# Patient Record
Sex: Male | Born: 1975
Health system: Southern US, Community
[De-identification: ages and names within clinical notes are randomized; demographics above are authoritative.]

## PROBLEM LIST (undated history)

## (undated) ENCOUNTER — Emergency Department (HOSPITAL_COMMUNITY): Admission: EM | Payer: BC Managed Care – PPO

## (undated) DIAGNOSIS — D689 Coagulation defect, unspecified: Secondary | ICD-10-CM

## (undated) DIAGNOSIS — D6800 Von Willebrand disease, unspecified: Secondary | ICD-10-CM

## (undated) DIAGNOSIS — E079 Disorder of thyroid, unspecified: Secondary | ICD-10-CM

## (undated) DIAGNOSIS — M1990 Unspecified osteoarthritis, unspecified site: Secondary | ICD-10-CM

## (undated) HISTORY — PX: APPENDECTOMY: SHX54

## (undated) HISTORY — DX: Von Willebrand's disease: D68.0

## (undated) HISTORY — PX: WISDOM TOOTH EXTRACTION: SHX21

## (undated) HISTORY — DX: Unspecified osteoarthritis, unspecified site: M19.90

## (undated) HISTORY — DX: Von Willebrand disease, unspecified: D68.00

## (undated) HISTORY — DX: Disorder of thyroid, unspecified: E07.9

## (undated) HISTORY — DX: Coagulation defect, unspecified: D68.9

---

## 2001-01-17 HISTORY — PX: OTHER SURGICAL HISTORY: SHX169

## 2009-12-01 ENCOUNTER — Encounter: Admission: RE | Admit: 2009-12-01 | Discharge: 2009-12-01 | Payer: Self-pay | Admitting: Family Medicine

## 2010-02-06 ENCOUNTER — Other Ambulatory Visit: Payer: Self-pay | Admitting: Family Medicine

## 2010-02-06 DIAGNOSIS — E041 Nontoxic single thyroid nodule: Secondary | ICD-10-CM

## 2010-05-31 ENCOUNTER — Ambulatory Visit
Admission: RE | Admit: 2010-05-31 | Discharge: 2010-05-31 | Disposition: A | Discharge status: Home / Self Care | Payer: BC Managed Care – PPO | Source: Ambulatory Visit | Attending: Family Medicine | Admitting: Family Medicine

## 2010-05-31 DIAGNOSIS — E041 Nontoxic single thyroid nodule: Secondary | ICD-10-CM

## 2010-06-03 ENCOUNTER — Other Ambulatory Visit: Payer: Self-pay | Admitting: Family Medicine

## 2010-06-03 DIAGNOSIS — E041 Nontoxic single thyroid nodule: Secondary | ICD-10-CM

## 2010-06-15 ENCOUNTER — Other Ambulatory Visit: Payer: Self-pay | Admitting: Family Medicine

## 2010-06-15 ENCOUNTER — Ambulatory Visit
Admission: RE | Admit: 2010-06-15 | Discharge: 2010-06-15 | Disposition: A | Discharge status: Home / Self Care | Payer: BC Managed Care – PPO | Source: Ambulatory Visit | Attending: Family Medicine | Admitting: Family Medicine

## 2010-06-15 DIAGNOSIS — E041 Nontoxic single thyroid nodule: Secondary | ICD-10-CM

## 2010-08-27 ENCOUNTER — Ambulatory Visit (INDEPENDENT_AMBULATORY_CARE_PROVIDER_SITE_OTHER): Payer: BC Managed Care – PPO | Admitting: Internal Medicine

## 2010-08-27 ENCOUNTER — Encounter: Payer: Self-pay | Admitting: Internal Medicine

## 2010-08-27 VITALS — BP 104/80 | HR 63 | Temp 98.5°F | Resp 14 | Ht 76.5 in | Wt 233.6 lb

## 2010-08-27 DIAGNOSIS — R350 Frequency of micturition: Secondary | ICD-10-CM

## 2010-08-27 DIAGNOSIS — Z0000 Encounter for general adult medical examination without abnormal findings: Secondary | ICD-10-CM

## 2010-08-27 DIAGNOSIS — M2550 Pain in unspecified joint: Secondary | ICD-10-CM

## 2010-08-27 DIAGNOSIS — Z23 Encounter for immunization: Secondary | ICD-10-CM

## 2010-08-27 LAB — POCT URINALYSIS DIPSTICK
Bilirubin, UA: NEGATIVE
Blood, UA: NEGATIVE
Nitrite, UA: NEGATIVE
Protein, UA: NEGATIVE
pH, UA: 7

## 2010-08-27 MED ORDER — TETANUS-DIPHTH-ACELL PERTUSSIS 5-2.5-18.5 LF-MCG/0.5 IM SUSP
0.5000 mL | Freq: Once | INTRAMUSCULAR | Status: AC
  Administered 2010-08-27: 0.5 mL via INTRAMUSCULAR

## 2010-08-27 NOTE — Patient Instructions (Signed)
Preventive Health Care: Eat a low-fat diet with lots of fruits and vegetables, up to 7-9 servings per day. Consume less than 40 grams of sugar per day from foods & drinks with High Fructose Corn Sugar as #1,2,3 or # 4 on label. Interventions to raise HDL or GOOD cholesterol include: exercising 30-45 minutes 3-4 X per week; including salmon & tuna in the diet;  & supplementing with Omega 3 fatty acids (Flax or Fish oil )  1-2 grams per day. The B vitamin Niacin also raises HDL but has a vasodilating effect which may cause flushing.

## 2010-08-27 NOTE — Progress Notes (Signed)
Subjective:    Patient ID: Chris Perez, male    DOB: 26-Sep-1975, 35 y.o.   MRN: [id number]  HPI  Chris Perez  is here for a physical;acute issues  Include diffuse arthralgias. He's had symptoms especially in the lower extremities but also involving the hands shoulders jaw for 6-7 years. Glucosamine sulfate/chondroitin supplement for 4 months did not seem to be of benefit. He avoids aspirin because of von Willebrand's variant. His mother has "arthritis"       Review of Systems Patient reports no significant  vision/ hearing changes,anorexia, weight change, fever ,adenopathy, persistant / recurrent hoarseness, swallowing issues, chest pain,palpitations, edema,persistant / recurrent cough, hemoptysis, dyspnea(rest, exertional, paroxysmal nocturnal), gastrointestinal  bleeding (melena, rectal bleeding), abdominal pain, excessive heart burn, GU symptoms( dysuria, hematuria, pyuria) syncope, focal weakness,numbness & tingling, skin/hair/nail changes,depression, anxiety, abnormal bruising/bleeding,myalgias or joint swelling or redness.  He states he had a physical in November of 2011 Labs were normal except for slightly reduced HDL. He has been taking Fish oil  for that reason. He states his white count was low in 2009. He is unsure as to whether it has changed.     Objective:   Physical Exam Gen.: Healthy and well-nourished in appearance. Alert, appropriate and cooperative throughout exam. Head: Normocephalic without obvious abnormalities;  no alopecia  Eyes: No corneal or conjunctival inflammation noted. Pupils equal round reactive to light and accommodation. Fundal exam is benign without hemorrhages, exudate, papilledema. Extraocular motion intact. Vision grossly normal with lenses. Ears: External  ear exam reveals no significant lesions or deformities. Canals clear .TMs normal. Hearing is grossly normal bilaterally. Nose: External nasal exam reveals no deformity or inflammation. Nasal mucosa are pink  and moist. No lesions or exudates noted. Septum not deviated Mouth: Oral mucosa and oropharynx reveal no lesions or exudates. Teeth in good repair. Neck: No deformities, masses, or tenderness noted. Range of motion & . Thyroid normal. Lungs: Normal respiratory effort; chest expands symmetrically. Lungs are clear to auscultation without rales, wheezes, or increased work of breathing. Heart: Normal rate and rhythm. Normal S1 and S2. No gallop, click, or rub. S4 vs slight click @ apex ; no  murmur. Abdomen: Bowel sounds normal; abdomen soft and nontender. No masses, organomegaly or hernias noted. Genitalia: Normal exam except for very small granuloma in the right supratesticular area.  .                                                                                   Musculoskeletal/extremities: No deformity or scoliosis noted of  the thoracic or lumbar spine. No clubbing, cyanosis, edema, or deformity noted. Range of motion  normal .Tone & strength  normal.Joints normal. Nail health  good. Vascular: Carotid, radial artery, dorsalis pedis and  posterior tibial pulses are full and equal. No bruits present. Neurologic: Alert and oriented x3. Deep tendon reflexes symmetrical and normal.          Skin: Intact without suspicious lesions or rashes. Lymph: No cervical, axillary, or inguinal lymphadenopathy present. Psych: Mood and affect are normal. Normally interactive  Assessment & Plan:  #1 comprehensive physical exam; no acute findings #2 see Problem List with Assessments & Recommendations #3 diffuse arthralgias with negative musculoskeletal and dermatologic exams. Plan: see Orders

## 2010-08-31 ENCOUNTER — Ambulatory Visit: Payer: Self-pay | Admitting: Internal Medicine

## 2010-09-07 ENCOUNTER — Other Ambulatory Visit: Payer: Self-pay | Admitting: Internal Medicine

## 2010-09-07 ENCOUNTER — Other Ambulatory Visit: Payer: Self-pay | Admitting: *Deleted

## 2010-09-07 DIAGNOSIS — Z0000 Encounter for general adult medical examination without abnormal findings: Secondary | ICD-10-CM

## 2010-09-09 ENCOUNTER — Other Ambulatory Visit (INDEPENDENT_AMBULATORY_CARE_PROVIDER_SITE_OTHER): Payer: BC Managed Care – PPO

## 2010-09-09 DIAGNOSIS — Z0000 Encounter for general adult medical examination without abnormal findings: Secondary | ICD-10-CM

## 2010-09-09 LAB — CBC WITH DIFFERENTIAL/PLATELET
Eosinophils Relative: 1.5 % (ref 0.0–5.0)
HCT: 43.1 % (ref 39.0–52.0)
Hemoglobin: 14.6 g/dL (ref 13.0–17.0)
Lymphocytes Relative: 33.8 % (ref 12.0–46.0)
Lymphs Abs: 1.1 10*3/uL (ref 0.7–4.0)
Monocytes Relative: 9.4 % (ref 3.0–12.0)
Neutro Abs: 1.8 10*3/uL (ref 1.4–7.7)
RBC: 4.63 Mil/uL (ref 4.22–5.81)
WBC: 3.4 10*3/uL — ABNORMAL LOW (ref 4.5–10.5)

## 2010-09-09 LAB — LIPID PANEL
Cholesterol: 152 mg/dL (ref 0–200)
LDL Cholesterol: 72 mg/dL (ref 0–99)
Triglycerides: 186 mg/dL — ABNORMAL HIGH (ref 0.0–149.0)

## 2010-09-09 LAB — BASIC METABOLIC PANEL
CO2: 27 mEq/L (ref 19–32)
Calcium: 9.3 mg/dL (ref 8.4–10.5)
Chloride: 106 mEq/L (ref 96–112)
Creatinine, Ser: 0.9 mg/dL (ref 0.4–1.5)
Glucose, Bld: 95 mg/dL (ref 70–99)

## 2010-09-09 LAB — POCT URINALYSIS DIPSTICK
Blood, UA: NEGATIVE
Glucose, UA: NEGATIVE
Ketones, UA: NEGATIVE
Protein, UA: NEGATIVE
Spec Grav, UA: 1.01
Urobilinogen, UA: 0.2

## 2010-09-09 LAB — SEDIMENTATION RATE: Sed Rate: 9 mm/hr (ref 0–22)

## 2010-09-09 LAB — TSH: TSH: 2.76 u[IU]/mL (ref 0.35–5.50)

## 2010-09-09 LAB — HEPATIC FUNCTION PANEL: Albumin: 4.7 g/dL (ref 3.5–5.2)

## 2010-09-09 NOTE — Progress Notes (Signed)
Labs only

## 2010-11-25 ENCOUNTER — Encounter: Payer: Self-pay | Admitting: Internal Medicine

## 2010-11-26 ENCOUNTER — Ambulatory Visit (INDEPENDENT_AMBULATORY_CARE_PROVIDER_SITE_OTHER): Payer: BC Managed Care – PPO | Admitting: Internal Medicine

## 2010-11-26 ENCOUNTER — Encounter: Payer: Self-pay | Admitting: Internal Medicine

## 2010-11-26 DIAGNOSIS — R002 Palpitations: Secondary | ICD-10-CM

## 2010-11-26 DIAGNOSIS — D680 Von Willebrand's disease: Secondary | ICD-10-CM | POA: Insufficient documentation

## 2010-11-26 DIAGNOSIS — R51 Headache: Secondary | ICD-10-CM

## 2010-11-26 LAB — CBC WITH DIFFERENTIAL/PLATELET
Eosinophils Absolute: 0.1 10*3/uL (ref 0.0–0.7)
Eosinophils Relative: 1 % (ref 0–5)
HCT: 39.4 % (ref 39.0–52.0)
Lymphocytes Relative: 40 % (ref 12–46)
Lymphs Abs: 1.4 10*3/uL (ref 0.7–4.0)
MCH: 31.3 pg (ref 26.0–34.0)
MCV: 89.3 fL (ref 78.0–100.0)
Monocytes Absolute: 0.4 10*3/uL (ref 0.1–1.0)
Monocytes Relative: 11 % (ref 3–12)
Platelets: 246 10*3/uL (ref 150–400)
RBC: 4.41 MIL/uL (ref 4.22–5.81)
WBC: 3.6 10*3/uL — ABNORMAL LOW (ref 4.0–10.5)

## 2010-11-26 LAB — BASIC METABOLIC PANEL
BUN: 16 mg/dL (ref 6–23)
CO2: 26 mEq/L (ref 19–32)
Chloride: 100 mEq/L (ref 96–112)
Creat: 0.91 mg/dL (ref 0.50–1.35)
Glucose, Bld: 95 mg/dL (ref 70–99)

## 2010-11-26 MED ORDER — TRAMADOL HCL 50 MG PO TABS: 50.0000 mg | ORAL_TABLET | Freq: Four times a day (QID) | ORAL | Status: AC | PRN
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
## 2010-11-26 NOTE — Progress Notes (Signed)
Addended by: Legrand Como on: 11/26/2010 04:48 PM   Modules accepted: Orders

## 2010-11-26 NOTE — Patient Instructions (Addendum)
Please keep a diary of your headaches . Document  each occurrence on the calendar with notation of : #1 any prodrome ( any non headache symptom such as marked fatigue,visual changes, ,etc ) which precedes actual headache ; #2) severity on 1-10 scale; #3) any triggers ( food/ drink,enviromenntal or weather changes ,physical or emotional stress) in 8-12 hour period prior to the headache; & #4) response to any medications or other intervention. Please review "Headache" @ [facility] for additional information.   To prevent palpitations or premature beats, avoid stimulants such as decongestants, diet pills, nicotine, or caffeine (coffee, tea, cola, or chocolate) to excess.

## 2010-11-29 ENCOUNTER — Telehealth: Payer: Self-pay | Admitting: Internal Medicine

## 2010-11-29 NOTE — Telephone Encounter (Signed)
Pt is requesting a copy of 08/2010 labs and 11/26/10 OV. Mailed to pt.

## 2011-01-05 ENCOUNTER — Ambulatory Visit (INDEPENDENT_AMBULATORY_CARE_PROVIDER_SITE_OTHER): Payer: BC Managed Care – PPO | Admitting: Internal Medicine

## 2011-01-05 ENCOUNTER — Encounter: Payer: Self-pay | Admitting: Internal Medicine

## 2011-01-05 ENCOUNTER — Ambulatory Visit (INDEPENDENT_AMBULATORY_CARE_PROVIDER_SITE_OTHER)
Admission: RE | Admit: 2011-01-05 | Discharge: 2011-01-05 | Disposition: A | Discharge status: Home / Self Care | Payer: BC Managed Care – PPO | Source: Ambulatory Visit | Attending: Internal Medicine | Admitting: Internal Medicine

## 2011-01-05 VITALS — BP 120/78 | HR 82 | Temp 98.3°F | Wt 232.6 lb

## 2011-01-05 DIAGNOSIS — D680 Von Willebrand's disease: Secondary | ICD-10-CM

## 2011-01-05 DIAGNOSIS — R51 Headache: Secondary | ICD-10-CM

## 2011-01-05 MED ORDER — RIZATRIPTAN BENZOATE 10 MG PO TBDP: 10.0000 mg | ORAL_TABLET | ORAL | Status: DC | PRN

## 2011-01-05 NOTE — Patient Instructions (Signed)
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
## 2011-01-06 ENCOUNTER — Telehealth: Payer: Self-pay

## 2011-01-06 ENCOUNTER — Telehealth: Payer: Self-pay | Admitting: Internal Medicine

## 2011-01-06 ENCOUNTER — Emergency Department (HOSPITAL_COMMUNITY)
Admission: EM | Admit: 2011-01-06 | Discharge: 2011-01-06 | Disposition: A | Discharge status: Home / Self Care | Payer: BC Managed Care – PPO | Attending: Emergency Medicine | Admitting: Emergency Medicine

## 2011-01-06 ENCOUNTER — Encounter (HOSPITAL_COMMUNITY): Payer: Self-pay | Admitting: *Deleted

## 2011-01-06 DIAGNOSIS — D6800 Von Willebrand disease, unspecified: Secondary | ICD-10-CM | POA: Insufficient documentation

## 2011-01-06 DIAGNOSIS — G441 Vascular headache, not elsewhere classified: Secondary | ICD-10-CM

## 2011-01-06 DIAGNOSIS — G43909 Migraine, unspecified, not intractable, without status migrainosus: Secondary | ICD-10-CM

## 2011-01-06 MED ORDER — OXYCODONE-ACETAMINOPHEN 5-325 MG PO TABS: 1.0000 | ORAL_TABLET | ORAL | Status: AC | PRN

## 2011-01-06 MED ORDER — OXYCODONE-ACETAMINOPHEN 5-325 MG PO TABS
1.0000 | ORAL_TABLET | Freq: Once | ORAL | Status: AC
  Administered 2011-01-06: 1 via ORAL
  Filled 2011-01-06: qty 1

## 2011-01-06 MED ORDER — METOCLOPRAMIDE HCL 5 MG/ML IJ SOLN
10.0000 mg | Freq: Once | INTRAMUSCULAR | Status: AC
  Administered 2011-01-06: 10 mg via INTRAVENOUS
  Filled 2011-01-06: qty 2

## 2011-01-06 MED ORDER — DEXAMETHASONE SODIUM PHOSPHATE 10 MG/ML IJ SOLN
10.0000 mg | Freq: Once | INTRAMUSCULAR | Status: AC
  Administered 2011-01-06: 10 mg via INTRAVENOUS
  Filled 2011-01-06: qty 1

## 2011-01-06 MED ORDER — SODIUM CHLORIDE 0.9 % IV BOLUS (SEPSIS)
1000.0000 mL | Freq: Once | INTRAVENOUS | Status: AC
  Administered 2011-01-06: 1000 mL via INTRAVENOUS

## 2011-01-06 MED ORDER — DIPHENHYDRAMINE HCL 50 MG/ML IJ SOLN
25.0000 mg | Freq: Once | INTRAMUSCULAR | Status: AC
  Administered 2011-01-06: 25 mg via INTRAVENOUS
  Filled 2011-01-06: qty 1

## 2011-01-06 NOTE — ED Notes (Signed)
Report received, assumed care.  

## 2011-01-06 NOTE — Telephone Encounter (Signed)
Patient seen in ER and was told to contact primary care doctor to see if MRI to be ordered or referral  Per Dr.Hopper patient to be referred to Neurology, Referral coordinator informed referral placed ASAP

## 2011-01-06 NOTE — Telephone Encounter (Signed)
Recommend Percocet one every 4 hours as needed, dispense 15.

## 2011-01-06 NOTE — Telephone Encounter (Signed)
Per call from patient's wife, Dorene Grebe, neither the Tramadol or the Maxalt that was given to patient by Dr. Alwyn Ren have worked.  Are there other medication(s) for patient to try?  His appointment with [facility] is tomorrow, 01-07-11, at 12:30pm.  Please advise.

## 2011-01-06 NOTE — Telephone Encounter (Signed)
Rx printed, signed & ready for p/u. Pt's wife informed.
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
## 2011-01-07 ENCOUNTER — Encounter: Payer: Self-pay | Admitting: Neurology

## 2011-01-07 ENCOUNTER — Ambulatory Visit (INDEPENDENT_AMBULATORY_CARE_PROVIDER_SITE_OTHER): Payer: BC Managed Care – PPO | Admitting: Neurology

## 2011-01-07 VITALS — BP 100/64 | HR 80 | Wt 232.8 lb

## 2011-01-07 DIAGNOSIS — R51 Headache: Secondary | ICD-10-CM

## 2011-01-07 MED ORDER — AMITRIPTYLINE HCL 25 MG PO TABS: 25.0000 mg | ORAL_TABLET | Freq: Every day | ORAL | Status: DC
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
## 2011-01-07 NOTE — Patient Instructions (Signed)
Follow up appointment with Dr. Modesto Charon is on 02/21/11 at 10:30 am.  Your MRI is scheduled at [facility] on Thursday, Jan. 3rd at 5:00 pm.  Please arrive 15 minutes prior to the appointment time.  [phone].

## 2011-01-08 NOTE — ED Provider Notes (Signed)
Medical screening examination/treatment/procedure(s) were performed by non-physician practitioner and as supervising physician I was immediately available for consultation/collaboration.   Anella Nakata R Derwood Becraft, MD 01/08/11 0703 

## 2011-01-19 ENCOUNTER — Telehealth: Payer: Self-pay | Admitting: Neurology

## 2011-01-19 NOTE — Telephone Encounter (Signed)
Pt called to check on time of MRI scheduled for 01/20/2011. Attempted to return call to only phone number available, but they number seemed to disconnected. Unable to reach patient.

## 2011-01-20 ENCOUNTER — Ambulatory Visit (HOSPITAL_COMMUNITY)
Admission: RE | Admit: 2011-01-20 | Discharge: 2011-01-20 | Disposition: A | Discharge status: Home / Self Care | Payer: BC Managed Care – PPO | Source: Ambulatory Visit | Attending: Neurology | Admitting: Neurology

## 2011-01-20 DIAGNOSIS — R51 Headache: Secondary | ICD-10-CM | POA: Insufficient documentation

## 2011-01-20 DIAGNOSIS — J3489 Other specified disorders of nose and nasal sinuses: Secondary | ICD-10-CM | POA: Insufficient documentation

## 2011-01-20 MED ORDER — GADOBENATE DIMEGLUMINE 529 MG/ML IV SOLN
20.0000 mL | Freq: Once | INTRAVENOUS | Status: AC | PRN
  Administered 2011-01-20: 20 mL via INTRAVENOUS

## 2011-01-24 MED ORDER — GADOBENATE DIMEGLUMINE 529 MG/ML IV SOLN
20.0000 mL | Freq: Once | INTRAVENOUS | Status: AC | PRN
  Administered 2011-01-24: 20 mL via INTRAVENOUS

## 2011-02-21 ENCOUNTER — Ambulatory Visit: Payer: BC Managed Care – PPO | Admitting: Neurology

## 2011-03-10 ENCOUNTER — Encounter: Payer: Self-pay | Admitting: Internal Medicine

## 2011-03-10 ENCOUNTER — Ambulatory Visit (INDEPENDENT_AMBULATORY_CARE_PROVIDER_SITE_OTHER): Payer: BC Managed Care – PPO | Admitting: Internal Medicine

## 2011-03-10 VITALS — BP 104/78 | HR 76 | Temp 98.6°F | Wt 230.2 lb

## 2011-03-10 DIAGNOSIS — D489 Neoplasm of uncertain behavior, unspecified: Secondary | ICD-10-CM

## 2011-03-10 DIAGNOSIS — L0390 Cellulitis, unspecified: Secondary | ICD-10-CM

## 2011-03-10 DIAGNOSIS — L0291 Cutaneous abscess, unspecified: Secondary | ICD-10-CM

## 2011-03-10 MED ORDER — MUPIROCIN 2 % EX OINT: TOPICAL_OINTMENT | CUTANEOUS | Status: AC

## 2011-03-10 NOTE — Progress Notes (Signed)
  Subjective:    Patient ID: Chris Perez, male    DOB: 07-13-1975, 36 y.o.   MRN: [id number]  HPI 3 weeks ago it polypoid lesion arose at the angle of the right mandible without predisposition . Other than minor discomfort with trauma it is asymptomatic.  He's had no other similar lesions; specifically there've been no inguinal area or  genital warts.  He had a "mole" removed from the right upper back which was benign. There is no personal or family history of squamous cell, basal cell cancer or melanoma.   Review of Systems he's had no constitutional symptoms of fever, chills or sweats.      Objective:   Physical Exam he is healthy and well-nourished.  He has no lymphadenopathy about the neck or axilla.  He has no organomegaly or masses.  The operative site over the upper back has bland hyperpigmentation but is well healed.  He has a verrucoid, polypoid lesion at the angle of the right jaw with some basilar erythema.         Assessment & Plan:   #1 neoplasm, uncertain etiology. This appears to be a benign polypoid, verrucoid lesion. There is possible mild cellulitis at its base.  Plan: Bactroban twice a day after cleansing. Dermatologic consult for excision.

## 2011-03-10 NOTE — Patient Instructions (Signed)
Applied antibiotic ointment to the base of the lesion twice a day after cleansing with a mild soap.

## 2011-04-13 ENCOUNTER — Ambulatory Visit: Payer: BC Managed Care – PPO | Admitting: Internal Medicine

## 2011-04-13 DIAGNOSIS — Z0289 Encounter for other administrative examinations: Secondary | ICD-10-CM

## 2012-03-09 IMAGING — CT CT HEAD W/O CM
1 series · 16 of 30 positions shown, 20 images · non-contrast
Comparison: None

CLINICAL DATA: Headache, right retro-orbital pain

CT HEAD WITHOUT CONTRAST
TECHNIQUE: Contiguous axial images were obtained from the base of
the skull through the vertex without contrast.

[Series 2: head_seq -c 4.5 h37s st · axial · 0.47mm/px · z∈[-133,+11]mm · 16 of 36 slices shown, 20 images]
[im 2/36  brain]
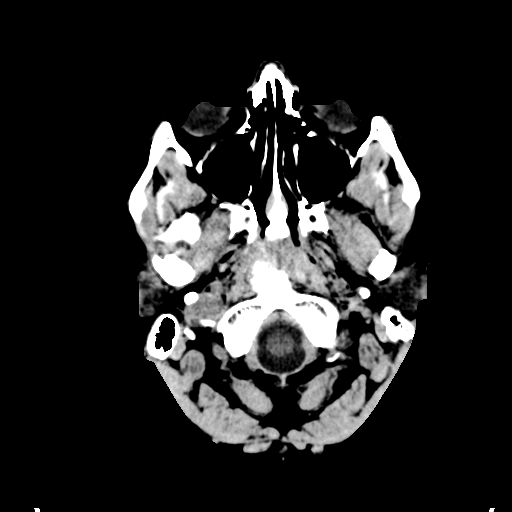
[im 2/36  bone]
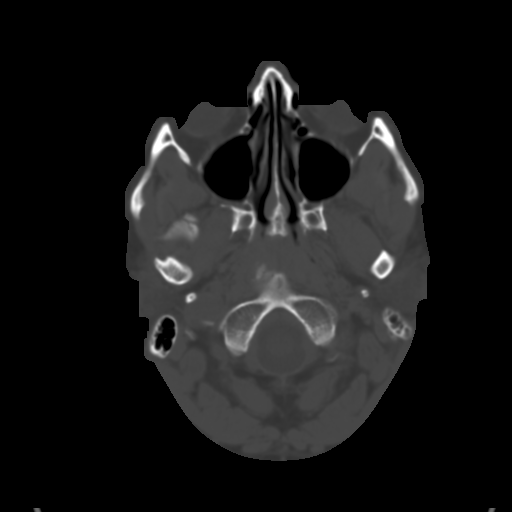
[im 4/36  brain]
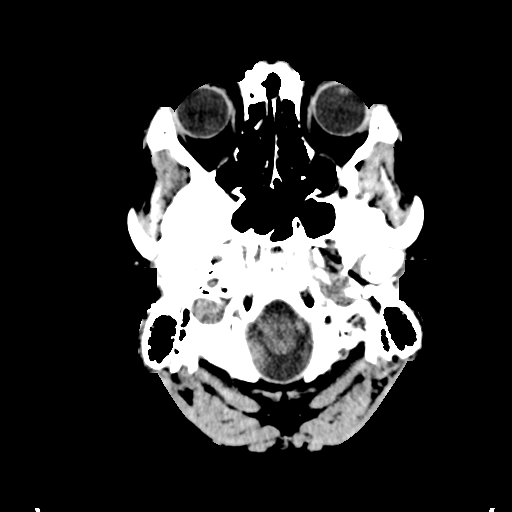
[im 7/36  brain]
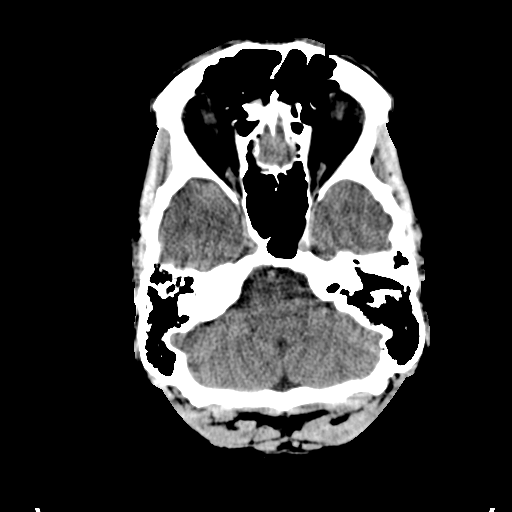
[im 9/36  brain]
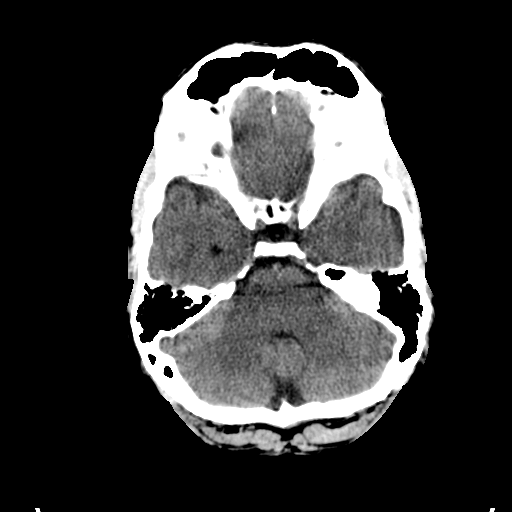
[im 10/36  brain]
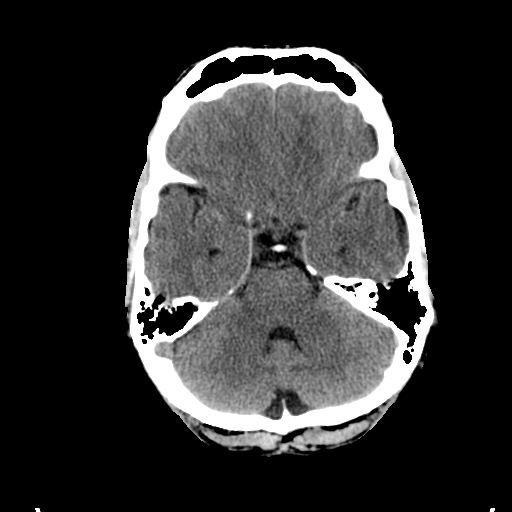
[im 10/36  bone]
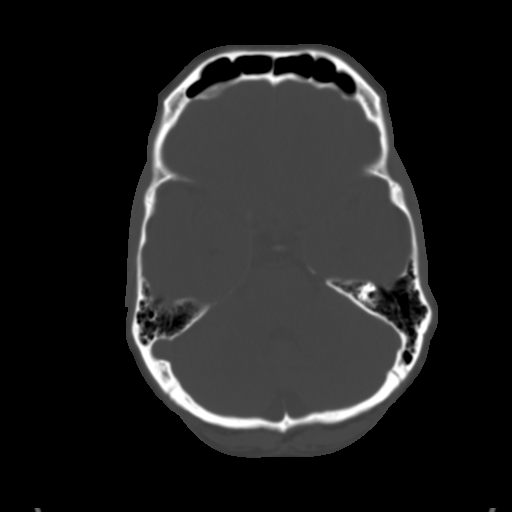
[im 13/36  brain]
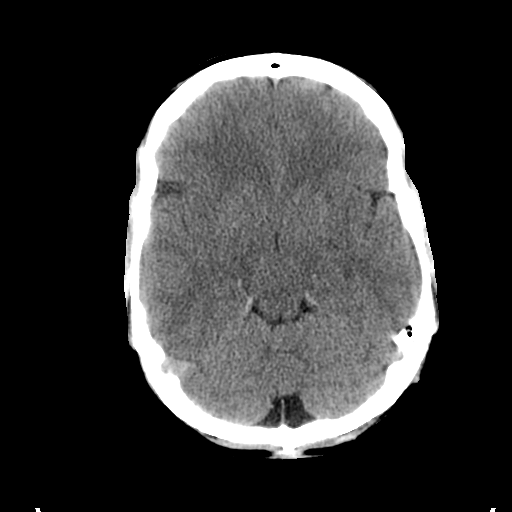
[im 15/36  brain]
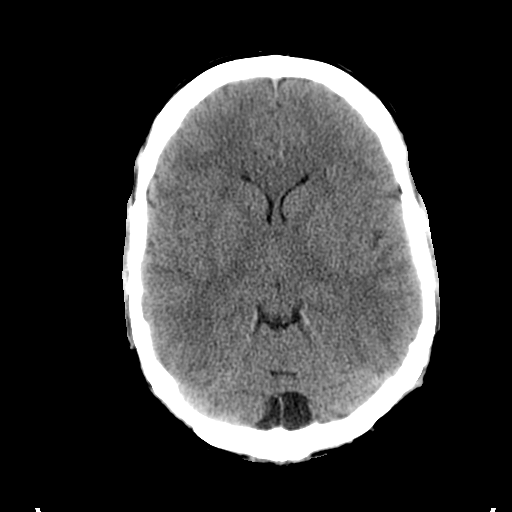
[im 17/36  brain]
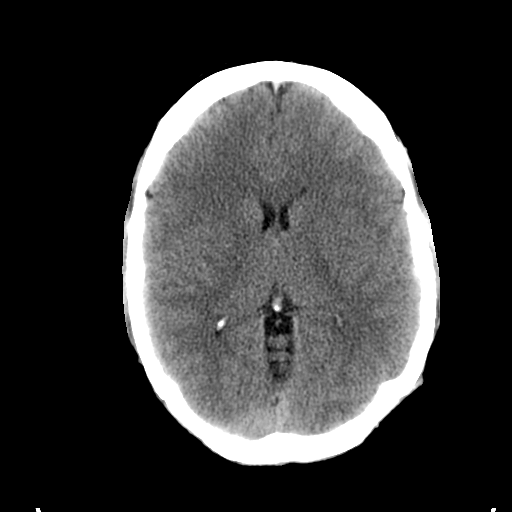
[im 19/36  brain]
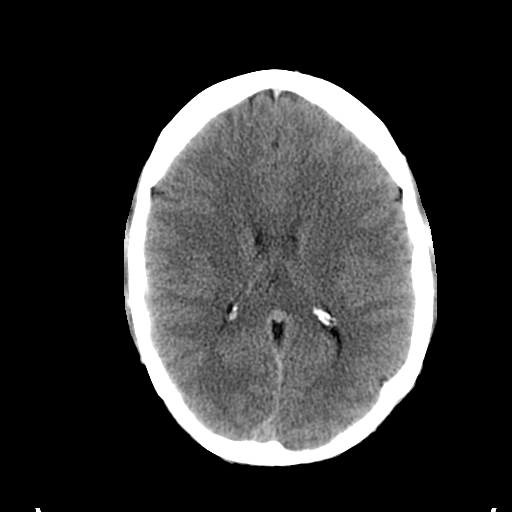
[im 19/36  bone]
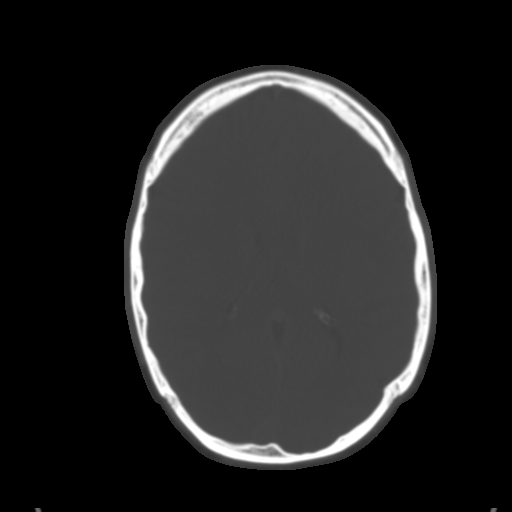
[im 21/36  brain]
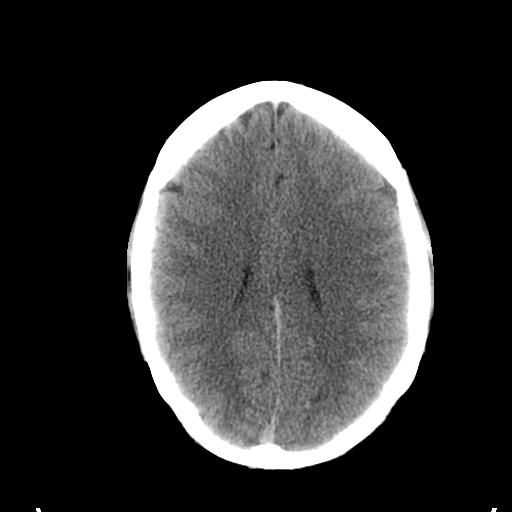
[im 23/36  brain]
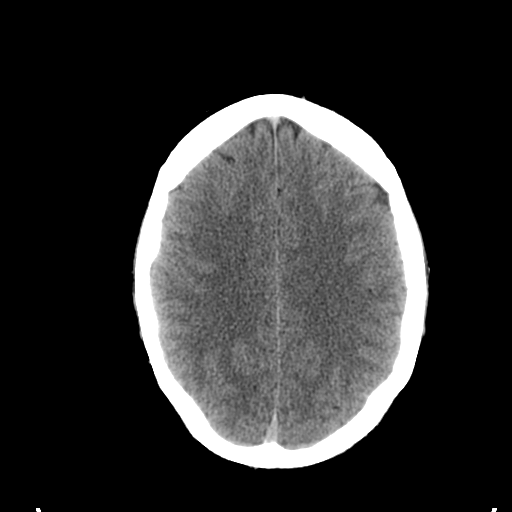
[im 26/36  brain]
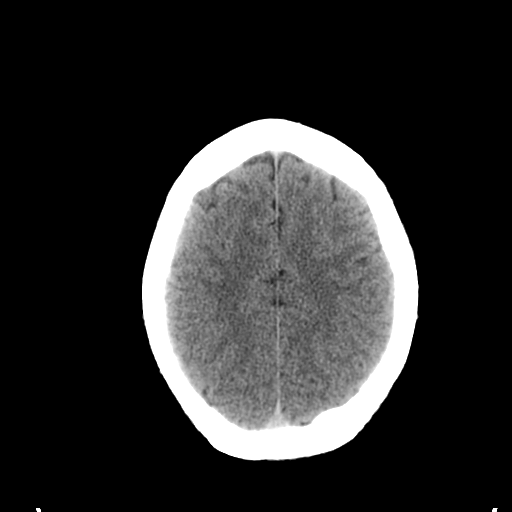
[im 27/36  brain]
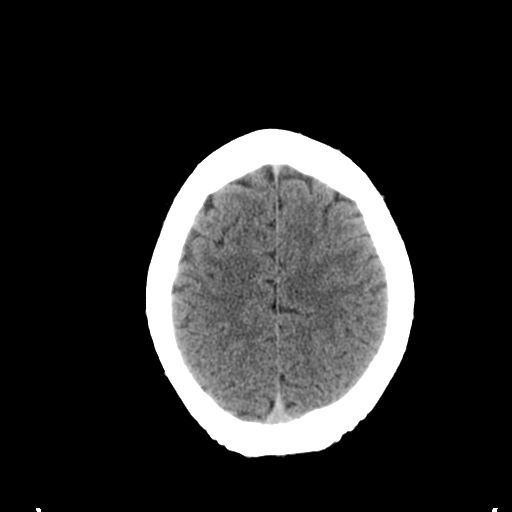
[im 27/36  bone]
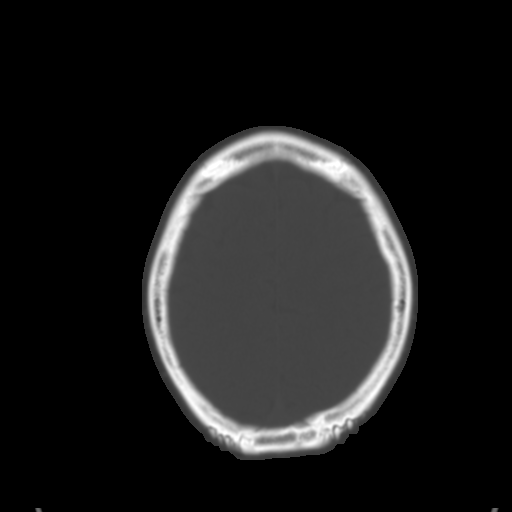
[im 29/36  brain]
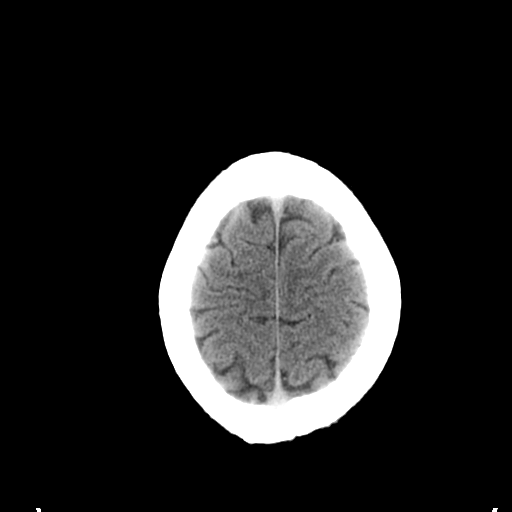
[im 32/36  brain]
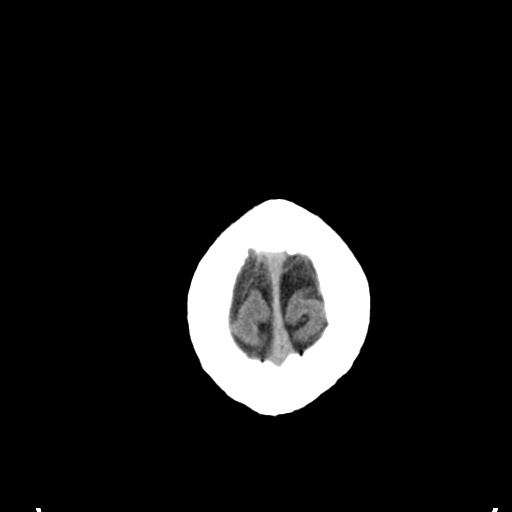
[im 34/36  brain]
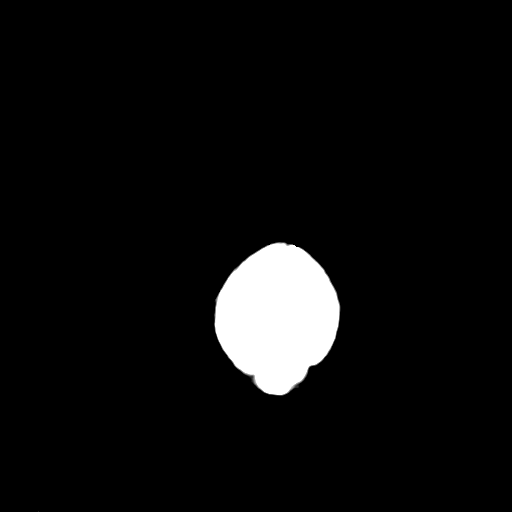

[16 of 30 positions shown; findings below may reference images not displayed]

FINDINGS: Normal ventricular morphology.
Prominent cisterna magna, normal variant.
No midline shift or mass effect.
Normal appearance of brain parenchyma.
No intracranial hemorrhage, mass lesion or evidence of acute
infarction.
No definite extra-axial fluid collections.
Few streak artifacts at skull base.
Orbital soft tissue planes clear.
Visualized paranasal sinuses and mastoid air cells clear.
Bones unremarkable.
IMPRESSION: No acute intracranial abnormalities.
If patient has persistent symptoms, consider MR imaging.

## 2012-03-22 ENCOUNTER — Ambulatory Visit: Payer: BC Managed Care – PPO | Admitting: Family Medicine

## 2012-03-30 ENCOUNTER — Encounter: Payer: Self-pay | Admitting: Family Medicine

## 2012-03-30 ENCOUNTER — Ambulatory Visit (INDEPENDENT_AMBULATORY_CARE_PROVIDER_SITE_OTHER): Payer: BC Managed Care – PPO | Admitting: Family Medicine

## 2012-03-30 VITALS — BP 108/74 | HR 92 | Temp 98.1°F | Ht 75.5 in | Wt 231.0 lb

## 2012-03-30 DIAGNOSIS — G43909 Migraine, unspecified, not intractable, without status migrainosus: Secondary | ICD-10-CM | POA: Insufficient documentation

## 2012-03-30 DIAGNOSIS — R946 Abnormal results of thyroid function studies: Secondary | ICD-10-CM

## 2012-03-30 DIAGNOSIS — F1011 Alcohol abuse, in remission: Secondary | ICD-10-CM

## 2012-03-30 DIAGNOSIS — R9389 Abnormal findings on diagnostic imaging of other specified body structures: Secondary | ICD-10-CM

## 2012-03-30 NOTE — Progress Notes (Addendum)
  Subjective:    Patient ID: Chris Perez, male    DOB: 1975/08/22, 37 y.o.   MRN: [id number]  HPI New to establish care.  Patient has remote history of alcohol and cocaine abuse. This was over 10 years ago. Totally abstinent since then.  History of von Willebrand disease. No recent bleeding complications  History of migraine headaches. These are infrequent. Takes no prevention medication. History of questionable thyroid nodule. He had followup thyroid ultrasound which did not confirm any definite nodule on second study. Strong family history of thyroid cancer in mother and sister.  Patient has some intermittent arthritic pains involving wrists and occasionally hips. Has never had any inflammatory changes. No known family history of rheumatoid or other inflammatory arthritis  Surgical history significant for appendectomy. Family history mother with presumed osteoarthritis Sister and mother with thyroid cancer as above. Questionable type.  Patient works as Runner, broadcasting/film/video. Quit smoking 2010. Occasional alcohol use.   Review of Systems  Constitutional: Negative for fever, activity change, appetite change and fatigue.  HENT: Negative for ear pain, congestion and trouble swallowing.   Eyes: Negative for pain and visual disturbance.  Respiratory: Negative for cough, shortness of breath and wheezing.   Cardiovascular: Negative for chest pain and palpitations.  Gastrointestinal: Negative for nausea, vomiting, abdominal pain, diarrhea, constipation, blood in stool, abdominal distention and rectal pain.  Genitourinary: Negative for dysuria, hematuria and testicular pain.  Musculoskeletal: Positive for arthralgias. Negative for joint swelling.  Skin: Negative for rash.  Neurological: Negative for dizziness, syncope and headaches.  Hematological: Negative for adenopathy.  Psychiatric/Behavioral: Negative for confusion and dysphoric mood.       Objective:   Physical Exam  Constitutional: He  appears well-developed and well-nourished.  HENT:  Mouth/Throat: Oropharynx is clear and moist.  Neck: Neck supple. No thyromegaly present.  Cardiovascular: Normal rate and regular rhythm.  Exam reveals no gallop.   No murmur heard. Pulmonary/Chest: Effort normal and breath sounds normal. No respiratory distress. He has no wheezes. He has no rales.  Musculoskeletal: He exhibits no edema.  Lymphadenopathy:    He has no cervical adenopathy.          Assessment & Plan:  #1 remote history of alcohol and drug abuse. Abstinent for several years #2 past history of questionable thyroid nodule. Followup study did not confirm nodule. Radiology recommended a one-year surveillance study. This has not been set up since previous studies 2012 #3 intermittent arthralgias. No evidence for joint inflammation. #4 History of ? palpitations. Question PACs or PVCs.  Minimize caffeine.  These are minimally symptomatic at this time. #5 history of migraines.  Infrequent.  Thyroid ultrasound 1.5 cm nodule right isthmus.  Pt notified.  Will set up FNA.  Pt agrees. Biopsy reveals "follicular lesion of undetermined significance". Discussed with patient. Set up to see general surgeon to get their input regarding later biopsies versus other intervention.

## 2012-03-30 NOTE — Patient Instructions (Signed)
Consider complete physical at some point later this year. 

## 2012-04-03 ENCOUNTER — Ambulatory Visit
Admission: RE | Admit: 2012-04-03 | Discharge: 2012-04-03 | Disposition: A | Discharge status: Home / Self Care | Payer: BC Managed Care – PPO | Source: Ambulatory Visit | Attending: Family Medicine | Admitting: Family Medicine

## 2012-04-03 DIAGNOSIS — R9389 Abnormal findings on diagnostic imaging of other specified body structures: Secondary | ICD-10-CM

## 2012-04-05 NOTE — Addendum Note (Signed)
Addended by: Kristian Covey on: 04/05/2012 09:49 PM   Modules accepted: Orders

## 2012-04-06 ENCOUNTER — Other Ambulatory Visit: Payer: Self-pay | Admitting: Family Medicine

## 2012-04-06 DIAGNOSIS — E041 Nontoxic single thyroid nodule: Secondary | ICD-10-CM

## 2012-04-17 ENCOUNTER — Ambulatory Visit
Admission: RE | Admit: 2012-04-17 | Discharge: 2012-04-17 | Disposition: A | Discharge status: Home / Self Care | Payer: BC Managed Care – PPO | Source: Ambulatory Visit | Attending: Family Medicine | Admitting: Family Medicine

## 2012-04-17 ENCOUNTER — Other Ambulatory Visit (HOSPITAL_COMMUNITY)
Admission: RE | Admit: 2012-04-17 | Discharge: 2012-04-17 | Disposition: A | Discharge status: Home / Self Care | Payer: BC Managed Care – PPO | Source: Ambulatory Visit | Attending: Interventional Radiology | Admitting: Interventional Radiology

## 2012-04-17 DIAGNOSIS — E0789 Other specified disorders of thyroid: Secondary | ICD-10-CM | POA: Insufficient documentation

## 2012-04-17 DIAGNOSIS — E041 Nontoxic single thyroid nodule: Secondary | ICD-10-CM

## 2012-04-24 NOTE — Addendum Note (Signed)
Addended by: Kristian Covey on: 04/24/2012 01:20 PM   Modules accepted: Orders

## 2012-05-02 ENCOUNTER — Telehealth: Payer: Self-pay | Admitting: Family Medicine

## 2012-05-02 NOTE — Telephone Encounter (Signed)
Spoke with pt he was confused about his upcomming appt he thought he was going to be having surgery. Pt was informed that it was just for a consultation with the general surgeron and that is he feels he needs surgery that is something he will speak with him about and his appt.

## 2012-05-02 NOTE — Telephone Encounter (Signed)
Pt states Dr Caryl Never called him last week about about his upcoming surgery on Mon May 07, 2012.  Pt has some questions that he forgot to ask. Pt would like MD to call him back. (Ie: How long is surgery and exactly what they will do.)

## 2012-05-07 ENCOUNTER — Encounter (INDEPENDENT_AMBULATORY_CARE_PROVIDER_SITE_OTHER): Payer: Self-pay | Admitting: Surgery

## 2012-05-07 ENCOUNTER — Ambulatory Visit (INDEPENDENT_AMBULATORY_CARE_PROVIDER_SITE_OTHER): Payer: BC Managed Care – PPO | Admitting: Surgery

## 2012-05-07 VITALS — BP 106/70 | HR 87 | Temp 98.0°F | Resp 16 | Ht 75.0 in | Wt 238.0 lb

## 2012-05-07 DIAGNOSIS — E042 Nontoxic multinodular goiter: Secondary | ICD-10-CM | POA: Insufficient documentation

## 2012-05-07 DIAGNOSIS — E063 Autoimmune thyroiditis: Secondary | ICD-10-CM

## 2012-05-07 DIAGNOSIS — E041 Nontoxic single thyroid nodule: Secondary | ICD-10-CM

## 2012-05-07 NOTE — Progress Notes (Signed)
General Surgery [facility]  Chief Complaint  Patient presents with  . New Evaluation    eval thyroid nodule - referral from Dr. [name]    HISTORY: Patient is a 37 year old white male referred by his primary care physician for evaluation of thyroid nodule. Patient has a known history of thyroid disease dating back approximately 2 years. At that time he underwent a thyroid ultrasound which suggested a nodule in the isthmus of the thyroid. Thyroid gland was very heterogeneous. At the time of attempted biopsy of the nodule could not be well defined and biopsy was not performed. Patient has changed her primary physicians. He is referred back for repeat thyroid ultrasound. This was due to a family history of thyroid disease in his mother and his sister, both of them underwent thyroidectomy for presumed thyroid cancer. Ultrasound performed 04/05/2012 shows an enlarged thyroid gland with the right lobe measuring 6.8 cm and left lobe measuring 6.2 cm. There is a small calcification in the right inferior pole. There is an 8 mm nodule in the right thyroid. There appears to be a 1.5 cm exophytic nodule in the right isthmus. Left thyroid lobe is lobulated but without discrete nodules identified. Biopsy was recommended on the 1.5 cm nodule.  Biopsy was performed. This shows a follicular lesion of undetermined significance. The pathologist however goes on to comment that this is suggestive of lymphocytic thyroiditis with a rare nuclear changes. A repeat biopsy is suggested at some point in the future. No overt signs of malignancy are identified.  Patient has had no prior head or neck surgery. There is no family history of other endocrinopathy except for the thyroid disease noted in his sister and his mother. Patient has never been on thyroid medication.  Past Medical History  Diagnosis Date  . Von Willebrands disease     variant; no complications to date  . Herpes zoster     T 9  level over abdomen     Current Outpatient Prescriptions  Medication Sig Dispense Refill  . amitriptyline (ELAVIL) 25 MG tablet Take 1 tablet (25 mg total) by mouth at bedtime.  30 tablet  3  . pseudoephedrine (SUDAFED) 30 MG tablet Take 30 mg by mouth every 4 (four) hours as needed. Nasal congestion       . rizatriptan (MAXALT-MLT) 10 MG disintegrating tablet Take 1 tablet (10 mg total) by mouth as needed for migraine. May repeat in 2 hours if needed  10 tablet  0   No current facility-administered medications for this visit.     Allergies  Allergen Reactions  . Aspirin     Due to medical condition  (von Willebrand's variant)     Family History  Problem Relation Age of Onset  . Arthritis Mother   . Thyroid disease Mother     on medication   . Cancer Mother     thyroid cancer  . Thyroid disease Sister     on medication   . Cancer Sister     thyroid cancer  . Von Willebrand disease      mother, niece & ? sister     History   Social History  . Marital Status: Married    Spouse Name: N/A    Number of Children: N/A  . Years of Education: N/A   Social History Main Topics  . Smoking status: Former Smoker    Quit date: 03/17/2008  . Smokeless tobacco: Never Used  . Alcohol Use: 1.8 oz/week  3 Cans of beer per week  . Drug Use: No  . Sexually Active: None   Other Topics Concern  . None   Social History Narrative  . None     REVIEW OF SYSTEMS - PERTINENT POSITIVES ONLY: Denies tremor. Denies palpitations. Denies compressive symptoms. Denies pain. Denies mass.  EXAM: Filed Vitals:   05/07/12 1613  BP: 106/70  Pulse: 87  Temp: 98 F (36.7 C)  Resp: 16    HEENT: normocephalic; pupils equal and reactive; sclerae clear; dentition good; mucous membranes moist NECK:  Slightly firm and slightly nodular left and right lobes of the thyroid without discrete or dominant mass; symmetric on extension; no palpable anterior or posterior cervical lymphadenopathy; no  supraclavicular masses; no tenderness CHEST: clear to auscultation bilaterally without rales, rhonchi, or wheezes CARDIAC: regular rate and rhythm without significant murmur; peripheral pulses are full EXT:  non-tender without edema; no deformity NEURO: no gross focal deficits; no sign of tremor   LABORATORY RESULTS: See [facility] ([facility]-Epic) for most recent results   RADIOLOGY RESULTS: See [facility] ([facility]L-Epic) for most recent results   IMPRESSION: #1 nodule in right thyroid isthmus, 1.5 cm, with indeterminate cytopathology #2 probable lymphocytic thyroiditis  PLAN: The patient and I discussed the above findings at length. We reviewed the reports. We discussed options for management.  I have recommended close followup without surgical intervention at this time. I would like to obtain a TSH level, at T3 level, and a T4 level now. I would like to obtain a followup thyroid ultrasound in 6 months and see the patient back for physical examination at that time.  The patient may have lymphocytic thyroiditis which would explain the difficulty in isolating a defined nodule on his prior examination, the heterogeneous nature of this thyroid ultrasounds, and the abnormality seen on cytopathology. The patient and I discussed this at length. I offered him immediate surgical intervention if he desired, but I do not think this is the best course of action. He agrees with close follow-up and will return in 6 months. I will contact him with the results of his thyroid panel.  Patient will return for physical examination and review of his thyroid ultrasound results in 6 months.  Velora Heckler, MD, FACS [facility]   Visit Diagnoses: 1. Thyroid nodule, uninodular, right isthmus   2. Thyroiditis, lymphocytic     Primary Care Physician: Kristian Covey, MD

## 2012-05-07 NOTE — Patient Instructions (Signed)

## 2012-05-08 ENCOUNTER — Other Ambulatory Visit (INDEPENDENT_AMBULATORY_CARE_PROVIDER_SITE_OTHER): Payer: Self-pay | Admitting: Surgery

## 2012-05-08 LAB — T4: T4, Total: 4.9 ug/dL — ABNORMAL LOW (ref 5.0–12.5)

## 2012-05-08 LAB — TSH: TSH: 36.134 u[IU]/mL — ABNORMAL HIGH (ref 0.350–4.500)

## 2012-05-11 ENCOUNTER — Other Ambulatory Visit (INDEPENDENT_AMBULATORY_CARE_PROVIDER_SITE_OTHER): Payer: Self-pay | Admitting: Surgery

## 2012-05-11 DIAGNOSIS — E063 Autoimmune thyroiditis: Secondary | ICD-10-CM

## 2012-05-11 DIAGNOSIS — E039 Hypothyroidism, unspecified: Secondary | ICD-10-CM | POA: Insufficient documentation

## 2012-05-11 MED ORDER — SYNTHROID 75 MCG PO TABS: 75.0000 ug | ORAL_TABLET | Freq: Every day | ORAL | Status: DC

## 2012-05-14 ENCOUNTER — Telehealth (INDEPENDENT_AMBULATORY_CARE_PROVIDER_SITE_OTHER): Payer: Self-pay

## 2012-05-14 DIAGNOSIS — E039 Hypothyroidism, unspecified: Secondary | ICD-10-CM

## 2012-05-14 NOTE — Telephone Encounter (Signed)
Per Dr Gerrit Friends pt advised  synthroid  RX has been sent to pts pharmacy. Pt needs to begin medication and repeat labs in 6 wks. Lab slip mailed to pt for TSH and T4. Pt states he understands.

## 2012-05-17 ENCOUNTER — Telehealth (INDEPENDENT_AMBULATORY_CARE_PROVIDER_SITE_OTHER): Payer: Self-pay | Admitting: *Deleted

## 2012-05-17 NOTE — Telephone Encounter (Signed)
Patient called in today to state that he is planning to go get his lab draw at the 6 weeks however he wanted to make Gerkin MD aware that he doesn't want to take the Synthroid.  Patient states he wants to try a alternative before starting on a daily medication.  Patient stated iodine supplements is what he is going to begin on.

## 2012-07-04 ENCOUNTER — Telehealth (INDEPENDENT_AMBULATORY_CARE_PROVIDER_SITE_OTHER): Payer: Self-pay

## 2012-07-04 ENCOUNTER — Encounter (INDEPENDENT_AMBULATORY_CARE_PROVIDER_SITE_OTHER): Payer: Self-pay

## 2012-07-04 NOTE — Telephone Encounter (Signed)
LMOM for pt to call asap. Pt needs to know his tsh is 21.060. Norm is 0.450-4.5. Pt declined starting synthroid 05-17-2012. Per Dr Gerrit Friends Pt needs to start his synthroid 77 mcg that was sent via epic to [facility] [address] on 05-14-12. If pt has not picked med up or has changed pharmacy he needs to advise so we can call med in.

## 2012-07-05 ENCOUNTER — Telehealth: Payer: Self-pay | Admitting: Family Medicine

## 2012-07-05 ENCOUNTER — Telehealth (INDEPENDENT_AMBULATORY_CARE_PROVIDER_SITE_OTHER): Payer: Self-pay | Admitting: General Surgery

## 2012-07-05 NOTE — Telephone Encounter (Signed)
Call back attempted at 1240 on 6-19, vmail left.

## 2012-07-05 NOTE — Telephone Encounter (Signed)
Called patient, advised of his TSH level 21.060 and what the normal limits are. I verified Pharmacy location and let him know his prescription was ready to pick-up. I advised him Gerkin, MD wanted to see him back in the office in 6 months.

## 2012-07-05 NOTE — Telephone Encounter (Signed)
Caller: Chris Perez/Patient; Phone: [phone]; Reason for Call: Cancel his previous Medical records request returng a call to [facility]

## 2012-07-09 ENCOUNTER — Telehealth: Payer: Self-pay | Admitting: Family Medicine

## 2012-07-09 DIAGNOSIS — E063 Autoimmune thyroiditis: Secondary | ICD-10-CM

## 2012-07-09 DIAGNOSIS — E041 Nontoxic single thyroid nodule: Secondary | ICD-10-CM

## 2012-07-09 DIAGNOSIS — E039 Hypothyroidism, unspecified: Secondary | ICD-10-CM

## 2012-07-09 NOTE — Telephone Encounter (Signed)
PT calling to request a referral to a endocrinologist, please assist.

## 2012-07-09 NOTE — Telephone Encounter (Signed)
Let's set him up to see Dr Elvera Lennox with [facility].

## 2012-07-10 ENCOUNTER — Ambulatory Visit (INDEPENDENT_AMBULATORY_CARE_PROVIDER_SITE_OTHER): Payer: BC Managed Care – PPO | Admitting: Family Medicine

## 2012-07-10 ENCOUNTER — Encounter: Payer: Self-pay | Admitting: Family Medicine

## 2012-07-10 VITALS — BP 118/70 | HR 75 | Temp 98.3°F | Resp 16 | Wt 232.0 lb

## 2012-07-10 DIAGNOSIS — R200 Anesthesia of skin: Secondary | ICD-10-CM

## 2012-07-10 DIAGNOSIS — R209 Unspecified disturbances of skin sensation: Secondary | ICD-10-CM

## 2012-07-10 DIAGNOSIS — R079 Chest pain, unspecified: Secondary | ICD-10-CM

## 2012-07-10 DIAGNOSIS — R202 Paresthesia of skin: Secondary | ICD-10-CM

## 2012-07-10 NOTE — Patient Instructions (Addendum)
Follow up promptly for any chest pain or any recurrent numbness or new symptoms such as shortness of breath.

## 2012-07-10 NOTE — Progress Notes (Signed)
  Subjective:    Patient ID: Chris Perez, male    DOB: 11-22-1975, 37 y.o.   MRN: [id number]  HPI Patient last night had very transient symptoms after watching television of left chest sensation of "coldness" which only last about 10 seconds followed by a few seconds of tightness. He denied any dyspnea. No recent exertional symptoms. Patient also noted very brief -approximately 10 seconds- of left facial tingling without weakness and also some numbness involving left upper inner arm and left lateral leg. All these lasted about 10-20 seconds. No weakness. Denied speech changes, visual changes, headaches, dyspnea or new recent exertional symptoms.  Patient has hypothyroidism and has not yet started replacement. No other significant chronic problems. He does have history of migraine headaches but denies any recent associated headache. Nonsmoker. No family history of premature CAD.  Hx of reported Von Willebrands disease but no reported complications.  Past Medical History  Diagnosis Date  . Von Willebrands disease     variant; no complications to date  . Herpes zoster     T 9 level over abdomen   Past Surgical History  Procedure Laterality Date  . Wisdom tooth extraction    . Tonsillar abscess i& d  2003  . Appendectomy      Early  1990's     reports that he quit smoking about 4 years ago. He has never used smokeless tobacco. He reports that he drinks about 1.8 ounces of alcohol per week. He reports that he does not use illicit drugs. family history includes Arthritis in his mother; Cancer in his mother and sister; Thyroid disease in his mother and sister; and Von Willebrand disease in an unspecified family member. Allergies  Allergen Reactions  . Aspirin     Due to medical condition  (von Willebrand's variant)      Review of Systems  Constitutional: Negative for fever, chills, appetite change and fatigue.  HENT: Negative for trouble swallowing.   Eyes: Negative for visual  disturbance.  Respiratory: Negative for cough and shortness of breath.   Cardiovascular: Negative for palpitations and leg swelling.  Endocrine: Negative for polydipsia and polyuria.  Neurological: Positive for numbness. Negative for dizziness, seizures, syncope, facial asymmetry, weakness and headaches.  Psychiatric/Behavioral: Negative for confusion.       Objective:   Physical Exam  Constitutional: He is oriented to person, place, and time. He appears well-developed and well-nourished.  Eyes: Pupils are equal, round, and reactive to light.  Neck: Neck supple. No thyromegaly present.  No carotid bruits  Cardiovascular: Normal rate and regular rhythm.   No murmur heard. Neurological: He is alert and oriented to person, place, and time. He has normal reflexes. No cranial nerve deficit. He exhibits normal muscle tone. Coordination normal.  Psychiatric: He has a normal mood and affect. His behavior is normal.          Assessment & Plan:  Very transient paresthesia without weakness involving left arm and leg.  Non focal exam.  Very atypical chest symptoms.  EKG sinus bradycardia (54) with no acute findings.  He has been exercising regularly without difficulty.  Doubt migraine related with no recent headache.  He has not had any worrisome findings by exam or history otherwise.  Follow up promptly for any recurrent or new symptoms.

## 2012-07-10 NOTE — Telephone Encounter (Signed)
Patient informed, understood & agreed; referral to Endo placed/SLS

## 2012-08-03 ENCOUNTER — Encounter: Payer: Self-pay | Admitting: Internal Medicine

## 2012-08-03 ENCOUNTER — Ambulatory Visit (INDEPENDENT_AMBULATORY_CARE_PROVIDER_SITE_OTHER): Payer: BC Managed Care – PPO | Admitting: Internal Medicine

## 2012-08-03 VITALS — BP 112/62 | HR 83 | Temp 98.3°F | Resp 12 | Ht 75.0 in | Wt 231.0 lb

## 2012-08-03 DIAGNOSIS — E039 Hypothyroidism, unspecified: Secondary | ICD-10-CM

## 2012-08-03 DIAGNOSIS — E041 Nontoxic single thyroid nodule: Secondary | ICD-10-CM

## 2012-08-03 LAB — TSH: TSH: 9.03 u[IU]/mL — ABNORMAL HIGH (ref 0.35–5.50)

## 2012-08-03 LAB — T4, FREE: Free T4: 0.67 ng/dL (ref 0.60–1.60)

## 2012-08-03 NOTE — Patient Instructions (Addendum)
Please join MyChart. I will let you know the results through there.  Please return in 4 months but you might need to return for labs sooner.

## 2012-08-03 NOTE — Progress Notes (Addendum)
Patient ID: Chris Perez, male   DOB: 1975-11-21, 37 y.o.   MRN: [id number]   HPI  Chris Perez is a 37 y.o.-year-old male, referred by his PCP, Dr.Burchette, for evaluation for hypothyroidism and thyroid nodule (FLUS).  Patient was diagnosed with a thyroid nodule in 12/01/2009, by thyroid ultrasound that showed thyroid nodules - left isthmus: 1.2 x 1.2 x 0.5 cm, solid - mid isthmus: 0.6 x 0.4 x 0.4 cm, solid - right inferior thyroid lobe: 3 mm coarse calcification  A repeat ultrasound on 05/31/2010 - diffusely inhomogeneous thyroid gland, nodules: - right Isthmus: 0.4 x 0.6 x 0.9 cm, hypoechoic (previously 0.6 x 0.4 x 0.4 cm) - left Isthmus: 0.9 x 1.3 x 1.7 cm poorly marginated, hypoechoic (previously 0.5 x 1.2 x 1.2 cm) - right inferior thyroid lobe: 3 mm coarse calcification  An FNA was attempted on 06/15/2010, however, the left isthmic nodule was not identified at that time A repeat ultrasound on 04/06/2012 - diffusely inhomogeneous thyroid gland, nodules: - right isthmus: 0.8 cm, hypoechoic - right inferior isthmus: 1.5 cm, exophytic slightly hypoechoic - left lobe: slightly lobulated, without defined nodule - No lymphadenopathy FNA (04/17/2012) was performed on the right isthmic nodule: FLUS  He saw Dr. Gerrit Friends in consultation, and he suggested repeat ultrasound in 6 months. A TSH checked at that time was 36; she was advised to start levothyroxine, however he refused, initially mentioning that she would like to try iodine first to see if his hypothyroidism improved.   I reviewed pt's thyroid tests: Lab Results  Component Value Date   TSH 36.134* 05/08/2012   TSH 2.904 11/26/2010   TSH 2.76 09/09/2010  A TSH was repeated in 06/2012 (outside lab), and it was 21. He still refused to start levothyroxine at that time, as he believed that this was a good improvement.  Pt denies feeling nodules in neck, hoarseness, dysphagia/odynophagia, SOB with lying down; he c/o: - + fatigue - for  few years - no weight gain - no constipation - no dry skin - no hair falling - no problems with concentration - no depression  She has a + FH of thyroid disorders in: sister, mother, who have thyroid cancer. No h/o radiation tx to head or neck.  I reviewed his chart and he also has a history of von Willebrand disease (no bleeding problems so far), migraine headaches, distant history of alcohol and cocaine abuse.   ROS: Constitutional: no weight gain/loss, + fatigue, no subjective hyperthermia/hypothermia Eyes: no blurry vision, no xerophthalmia ENT: no sore throat, no nodules palpated in throat, no dysphagia/odynophagia, no hoarseness Cardiovascular: no CP/SOB/occas. palpitations/leg swelling Respiratory: no cough/SOB Gastrointestinal: no N/V/D/C Musculoskeletal: no muscle/+ joint aches Skin: no rashes Neurological: no tremors/numbness/tingling/dizziness Psychiatric: no depression/anxiety  Past Medical History  Diagnosis Date  . Von Willebrands disease     variant; no complications to date  . Herpes zoster     T 9 level over abdomen  . Thyroid disease     Past Surgical History  Procedure Laterality Date  . Wisdom tooth extraction    . Tonsillar abscess i& d  2003  . Appendectomy      Early  1990's    History   Social History  . Marital Status: Married    Spouse Name: N/A    Number of Children: 1, 2 y/o   Occupational History  . Teacher   Social History Main Topics  . Smoking status: Former Smoker    Quit date: 03/17/2008  .  Smokeless tobacco: Never Used  . Alcohol Use: 1.8 oz/week    3 Cans of beer per week  . Drug Use: No  . Sexually Active: Yes -- Male partner(s)   Social History Narrative   Regular exercise: biking 2 x a week   Caffeine Use: seldom   Current Outpatient Prescriptions on File Prior to Visit  Medication Sig Dispense Refill  . SYNTHROID 75 MCG tablet Take 1 tablet (75 mcg total) by mouth daily before breakfast.  30 tablet  6   No  current facility-administered medications on file prior to visit.   Allergies  Allergen Reactions  . Aspirin     Due to medical condition  (von Willebrand's variant)   Family History  Problem Relation Age of Onset  . Arthritis Mother   . Thyroid disease Mother     on medication   . Cancer Mother     thyroid cancer  . Thyroid disease Sister     on medication   . Cancer Sister     thyroid cancer  . Von Willebrand disease      mother, niece & ? sister   PE: BP 112/62  Pulse 83  Temp(Src) 98.3 F (36.8 C) (Oral)  Resp 12  Ht 6\' 3"  (1.905 m)  Wt 231 lb (104.781 kg)  BMI 28.87 kg/m2  SpO2 97% Wt Readings from Last 3 Encounters:  08/03/12 231 lb (104.781 kg)  07/10/12 232 lb (105.235 kg)  05/07/12 238 lb (107.956 kg)   Constitutional: normal weight, in NAD Eyes: PERRLA, EOMI, no exophthalmos ENT: moist mucous membranes, no thyromegaly, no nodules palpated in neck, no cervical lymphadenopathy Cardiovascular: RRR, No MRG Respiratory: CTA B Gastrointestinal: abdomen soft, NT, ND, BS+ Musculoskeletal: no deformities, strength intact in all 4 Skin: moist, warm, no rashes Neurological: no tremor with outstretched hands, DTR normal in all 4  ASSESSMENT: 1. Hypothyroidism  2. Thyroid nodule - 1.5 cm, isthmic - FNA: FLUS  PLAN:  1. Hypothyroidism - I discussed with the patient that his TSH was high, explaining how this correlates with the thyroid function, which is usually inversely proportional. Since his thyroid echotexture was seen homogeneous at least for the last 2 years, I believe that this is most likely secondary to Hashimoto's thyroiditis, rather than regular silent thyroiditis. I cannot be sure of this, so we'll check anti-TPO antibodies today.  - The patient is curious whether he we'll need to take thyroid hormones for the rest of his life, and I believe he does, however will check his thyroids function tests today to make sure that this hypothyroidism has not  resolved. In that case, he might have had a transient episode of silent thyroiditis. - I explained why increasing the iodine in diet (he started to use more kelp) we'll likely not resolve his hypothyroidism - impaired reversal of Wolff-Chaikoff effect in hypothyroidism. I suggested that, if his TSH is still high, we start thyroid hormones. We discussed about the difference between free T4 and Armour Thyroid, and I explained the following: We discussed about positive and negative aspects of using Armour thyroid. I underlined the fact that:  Armour is purified from porcine thyroid glands, which is not without risk for contaminants  Also, the ratio between T4 and T3 in Armour is physiologic for pigs, not for humans.  The short half life of T3 can cause fluctuations in blood levels, which can result in mood swings and heart rhythm abnormalities.  The concentration of the active substances (T4 and T3)  can be expected to vary between different Armour lots, which can cause variation in the thyroid function tests.  He understands, and agrees to continue with Synthroid if he needs replacement. - If we start levothyroxine, I explained that he will need to come back in 6-8 weeks for labs  2. FLUS - He also discussed about his recent diagnosis of follicular lesion of undetermined significance, and I explained that this usually is associated with some 5-15% chances of thyroid cancer. In his case, since he is a man and he has family history of thyroid cancer in 2 members, I believe that his chances are up to about 15%. - We discussed about the Bethesda classification of thyroid FNA results, and the fact that, in the case of FLUS, another biopsy is recommended in 6 months to a year after the initial result - There is no family history of calcium disorders or pituitary/adrenal tumors; but he will let me know what type of cancer his mother and sister have - I advised him to join my chart for easier communication - He  will return in 4 months, and at that time, we'll most likely order a new FNA  Office Visit on 08/03/2012  Component Date Value Range Status  . TSH 08/03/2012 9.03* 0.35 - 5.50 uIU/mL Final  . Free T4 08/03/2012 0.67  0.60 - 1.60 ng/dL Final  . T3, Free 16/10/9602 2.6  2.3 - 4.2 pg/mL Final   Msg sent to pt: Dear Mr cardiff, Interestingly, your TSH is, indeed, lower!!! The actual thyroid hormones t3 and t4 are normal. You were intuitively right to not start Levothyroxine! Let's stay off it and recheck tests in 2 months. You can just return to the labs at your convenience then, between 8 am and 4 pm.  I will send you the result of your antibody titer when it comes back. Please let me know if you have any questions. Sincerely, Carlus Pavlov MD  . Thyroid Peroxidase Antibody 08/03/2012 39.8* <35.0 IU/mL Final   Comment:                            The thyroid microsomal antigen has been shown to be Thyroid                          Peroxidase (TPO).  This assay detects anti-TPO antibodies.   Msg sent to pt: Dear Mr bremer, Slightly high thyroid antibodies. No change in plan.  Please let me know if you have any questions. Sincerely, Carlus Pavlov MD

## 2012-08-05 ENCOUNTER — Encounter: Payer: Self-pay | Admitting: Internal Medicine

## 2012-11-22 ENCOUNTER — Other Ambulatory Visit: Payer: Self-pay

## 2012-11-25 ENCOUNTER — Encounter: Payer: Self-pay | Admitting: Internal Medicine

## 2012-11-28 ENCOUNTER — Other Ambulatory Visit: Payer: Self-pay | Admitting: Internal Medicine

## 2012-11-29 ENCOUNTER — Encounter: Payer: Self-pay | Admitting: Internal Medicine

## 2012-11-29 LAB — THYROID PEROXIDASE ANTIBODY: Thyroperoxidase Ab SerPl-aCnc: 17 IU/mL (ref <35.0)

## 2012-11-30 ENCOUNTER — Encounter: Payer: Self-pay | Admitting: Internal Medicine

## 2012-12-03 ENCOUNTER — Other Ambulatory Visit: Payer: Self-pay | Admitting: Internal Medicine

## 2012-12-04 LAB — TSH: TSH: 6.095 u[IU]/mL — ABNORMAL HIGH (ref 0.350–4.500)

## 2012-12-04 LAB — T3, FREE: T3, Free: 3 pg/mL (ref 2.3–4.2)

## 2012-12-07 ENCOUNTER — Ambulatory Visit (INDEPENDENT_AMBULATORY_CARE_PROVIDER_SITE_OTHER): Payer: BC Managed Care – PPO | Admitting: Internal Medicine

## 2012-12-07 ENCOUNTER — Encounter: Payer: Self-pay | Admitting: Internal Medicine

## 2012-12-07 VITALS — BP 116/70 | HR 80 | Temp 97.9°F | Resp 10 | Wt 236.9 lb

## 2012-12-07 DIAGNOSIS — E042 Nontoxic multinodular goiter: Secondary | ICD-10-CM

## 2012-12-07 DIAGNOSIS — E063 Autoimmune thyroiditis: Secondary | ICD-10-CM

## 2012-12-07 DIAGNOSIS — E039 Hypothyroidism, unspecified: Secondary | ICD-10-CM

## 2012-12-07 DIAGNOSIS — E041 Nontoxic single thyroid nodule: Secondary | ICD-10-CM

## 2012-12-07 NOTE — Patient Instructions (Addendum)
You will be called about the thyroid ultrasound appointment. Please return in 1 year for a visit and in 4 months for labs.

## 2012-12-07 NOTE — Progress Notes (Addendum)
Patient ID: Chris Perez, male   DOB: January 27, 1975, 37 y.o.   MRN: [id number]   HPI  Chris Perez is a 37 y.o.-year-old male, returning for f/u for hypothyroidism and thyroid nodule (FLUS). Last visit 4 mo ago.   Patient was diagnosed with a thyroid nodule in 12/01/2009, by thyroid ultrasound that showed thyroid nodules - left isthmus: 1.2 x 1.2 x 0.5 cm, solid - mid isthmus: 0.6 x 0.4 x 0.4 cm, solid - right inferior thyroid lobe: 3 mm coarse calcification  A repeat ultrasound on 05/31/2010 - diffusely inhomogeneous thyroid gland, nodules: - right Isthmus: 0.4 x 0.6 x 0.9 cm, hypoechoic (previously 0.6 x 0.4 x 0.4 cm) - left Isthmus: 0.9 x 1.3 x 1.7 cm poorly marginated, hypoechoic (previously 0.5 x 1.2 x 1.2 cm) - right inferior thyroid lobe: 3 mm coarse calcification  An FNA was attempted on 06/15/2010, however, the left isthmic nodule was not identified at that time A repeat ultrasound on 04/06/2012 - diffusely inhomogeneous thyroid gland, nodules: - right isthmus: 0.8 cm, hypoechoic - right inferior isthmus: 1.5 cm, exophytic slightly hypoechoic - left lobe: slightly lobulated, without defined nodule - No lymphadenopathy FNA (04/17/2012) was performed on the right isthmic nodule: FLUS  He saw Dr. Gerrit Friends in consultation, and he suggested repeat ultrasound in 6 months - but he did not return to see him last month as advised (tells me he was not aware of the appt).   In 04/2012, a TSH returned high, at 36; he was advised to start levothyroxine, however he refused, initially mentioning that she would like to try iodine first to see if his hypothyroidism improved. She came to see me after another TSH was still high, although lower, at 36 in 06/2012 (outside lab) after still refusing to start levothyroxine, as he believed that this was a good improvement. Indeed, his TSH decreased afterwards - see below.   I reviewed pt's thyroid tests: Lab Results  Component Value Date   TSH 6.095*  12/03/2012   TSH 9.03* 08/03/2012   TSH 36.134* 05/08/2012   TSH 2.904 11/26/2010   TSH 2.76 09/09/2010   FREET4 1.12 12/03/2012   FREET4 0.67 08/03/2012   Pt denies feeling nodules in neck, hoarseness, dysphagia/odynophagia, SOB with lying down; he c/o: - no fatigue - no weight gain - no constipation - no dry skin - no hair falling - no problems with concentration - no depression - pain in joints: Wrists, shoulders, neck  Of note, he has a + FH of thyroid disorders in: sister, mother, who have papillary thyroid cancer. No personal h/o radiation tx to head or neck.  He also has a history of von Willebrand disease (no bleeding problems so far), migraine headaches, distant history of alcohol and cocaine abuse.   ROS: Constitutional: no weight gain/loss, no fatigue, no subjective hyperthermia/hypothermia Eyes: no blurry vision, no xerophthalmia ENT: no sore throat, no nodules palpated in throat, no dysphagia/odynophagia, no hoarseness Cardiovascular: no CP/SOB/occas. palpitations/leg swelling Respiratory: no cough/SOB Gastrointestinal: no N/V/D/C Musculoskeletal: no muscle/+ joint aches Skin: no rashes Neurological: no tremors/numbness/tingling/dizziness  I reviewed pt's medications, allergies, PMH, social hx, family hx and no changes required, except as mentioned above.  PE: BP 116/70  Pulse 80  Temp(Src) 97.9 F (36.6 C) (Oral)  Resp 10  Wt 236 lb 14.4 oz (107.457 kg)  SpO2 96% Wt Readings from Last 3 Encounters:  12/07/12 236 lb 14.4 oz (107.457 kg)  08/03/12 36 lb (104.781 kg)  07/10/12 232 lb (105.235  kg)   Constitutional: normal weight, in NAD Eyes: PERRLA, EOMI, no exophthalmos ENT: moist mucous membranes, no thyromegaly, no nodules palpated in neck, no cervical lymphadenopathy Cardiovascular: RRR, No MRG Respiratory: CTA B Gastrointestinal: abdomen soft, NT, ND, BS+ Musculoskeletal: no deformities, strength intact in all 4 Skin: moist, warm, no  rashes Neurological: no tremor with outstretched hands, DTR normal in all 4  ASSESSMENT: 1. Hypothyroidism  2. Thyroid nodule - 1.5 cm, isthmic - FNA: FLUS  PLAN:  1. Hypothyroidism - Improving (off Levothyroxine)  2. FLUS - we again discussed about his recent diagnosis of follicular lesion of undetermined significance, and I explained that this usually is associated with 5-15% chances of thyroid cancer. In his case, since he is a man and he has family history of thyroid cancer in 2 members, I believe that his chances are higher. - We discussed about the Bethesda classification of thyroid FNA results, and the fact that, in the case of FLUS, another biopsy is recommended in 6 months to a year after the initial result. We are now 8 months after his previous thyroid ultrasound. - I advised him to return to see Dr. Gerrit Friends, but the patient would like me to order the biopsy, and will return to see Dr. Gerrit Friends when the results are back. I agreed to order initially a thyroid ultrasound to see if the nodule is still visible, and will then order the FNA - I wonder if we can perform the molecular diagnostic test on the Bx sample (Afirma Gene Expression Classifier, GEC), since this will help in differentiating between benign and malignant samples... I discussed with Dr. Luisa Hart in Cytology department, but he was not aware of the test being available here. - He will return in 1 year, assuming the biopsy will returned benign - he will need to return for repeat thyroid labs in 4 months  Orders Placed This Encounter  Procedures  . US Soft Tissue Head/Neck  . TSH  . T4, free  . T3, free   12/19/2012: Patient no showed for the appointment in radiology for thyroid ultrasound  I will addend the results when they become available.  12/26/2012: COMPARISON: 04/17/2012 and 04/03/2012 ultrasound.  FINDINGS:   Right thyroid lobe: 6.1 x 2.2 x 2.1 cm. 1.5 x 0.9 x 1.4 cm solid nodule.  Prominent  calcification lower pole region once again noted.   Left thyroid lobe: 6.0 x 1.8 x 2.1 cm. Questionable 0.8 x 0.9 x 1.2 cm  posterior lower pole nodule.   Isthmus: 0.5 cm. Within the superior aspect of the isthmus, there  is a 0.7 cm nodule. Inferior to this region, there is a 0.9 x 0.8 x  1.3 cm nodule. Previously this measured up to 1.5 cm.   Lymphadenopathy: None visualized.    IMPRESSION:  Isthmus nodules noted as discussed above largest measuring up to 1.3  cm versus prior 1.5 cm. Larger nodule was previously biopsied.  Within the upper pole of the right lobe of the thyroid gland,  nodularity is noted. This is appears slightly more distinct on the  current exam than on the prior examinations and I would favor 6  month follow up ultrasound for delineation of this area.  At this time, attention to questionable nodule posterior aspect of  the inferior pole of the left lobe recommended.   Electronically Signed  By: Bridgett Larsson M.D.  On: 12/26/2012 17:12   I reviewed the images of the above ultrasound, and I agree that the left thyroid  nodule is questionable. However, the right thyroid lobe nodule is easily distinct, which is new compared to the previous ultrasounds. I would suggest that we rebiopsy the inferior isthmic nodule, but also the right lobe nodule.  Msg sent: Dear Mr Otilio Perez, The new thyroid ultrasound shows that you have at least 3 nodules, with the right lobe nodule being new. The left thyroid nodule is very faint and I am not sure whether it is a distinct nodule at all. At this point, I would suggest that we re-biopsy the isthmus nodule, but also biopsy the right thyroid nodule. Please let me know what you think and I will order the biopsies if you agree. Regarding the molecular tests we discussed about: apparently they are not available here, unfortunately. Sincerely, Carlus Pavlov MD  01/22/2013: CLINICAL DATA: 37 year old male with isthmus thyroid nodule.   Request for a repeat biopsy.  EXAM:  ULTRASOUND GUIDED NEEDLE ASPIRATE BIOPSY OF THE THYROID GLAND  COMPARISON: Ultrasound 12/26/2012  FINDINGS:  Again noted is a nodule along the inferior aspect of the isthmus.  This nodule was successfully biopsied. The right thyroid lobe was  evaluated for an anterior nodule. The right thyroid lobe was  thoroughly evaluated and there is not a discrete nodule at the area  of concern. There appears to be a pseudo nodule at this location  related to heterogeneous tissue and a small cleft.  IMPRESSION:  Ultrasound guided needle aspirate biopsy performed of the isthmus  thyroid nodule.  A right thyroid nodule biopsy was not performed because a discrete  lesion was not identified. This area appears to represent a pseudo  nodule. Recommend attention to this area on follow up imaging.  PROCEDURE:  Thyroid biopsy was thoroughly discussed with the patient and  questions were answered. The benefits, risks, alternatives, and  complications were also discussed. The patient understands and  wishes to proceed with the procedure. Written consent was obtained.  Ultrasound was performed to localize and mark an adequate site for  the biopsy. The patient was then prepped and draped in a normal  sterile fashion. Local anesthesia was provided with 1% lidocaine.  Using direct ultrasound guidance, 4 passes were made using needles  into the nodule within the isthmus lobe of the thyroid. Ultrasound  was used to confirm needle placements on all occasions. Specimens  were sent to Pathology for analysis.  Complications: None  Electronically Signed  By: Richarda Overlie M.D.  On: 01/23/2013 11:34  01/23/2013 Adequacy Reason Satisfactory For Evaluation. Diagnosis THYROID, FINE NEEDLE ASPIRATION ISTHMUS, (SPECIMEN 1 OF 1, COLLECTED ON 01/22/13): FINDINGS CONSISTENT WITH NON-NEOPLASTIC GOITER. [place] RUND DO Pathologist, Electronic Signature (Case signed 01/24/2013) Specimen  Clinical Information Follow-up isthmic nodule, Same area prev bx'd 04/17/2012, Inferior to this region, there is a 0.9 x 0.8 x 1.3cm nodule, Previously this measured up to 1.5cm

## 2012-12-19 ENCOUNTER — Other Ambulatory Visit: Payer: BC Managed Care – PPO

## 2012-12-26 ENCOUNTER — Ambulatory Visit
Admission: RE | Admit: 2012-12-26 | Discharge: 2012-12-26 | Disposition: A | Discharge status: Home / Self Care | Payer: BC Managed Care – PPO | Source: Ambulatory Visit | Attending: Internal Medicine | Admitting: Internal Medicine

## 2012-12-26 ENCOUNTER — Other Ambulatory Visit: Payer: BC Managed Care – PPO

## 2012-12-26 DIAGNOSIS — E041 Nontoxic single thyroid nodule: Secondary | ICD-10-CM

## 2012-12-28 ENCOUNTER — Encounter: Payer: Self-pay | Admitting: Internal Medicine

## 2012-12-28 NOTE — Addendum Note (Signed)
Addended by: Carlus Pavlov on: 12/28/2012 02:46 PM   Modules accepted: Orders

## 2013-01-01 ENCOUNTER — Other Ambulatory Visit: Payer: Self-pay | Admitting: Internal Medicine

## 2013-01-01 DIAGNOSIS — E041 Nontoxic single thyroid nodule: Secondary | ICD-10-CM

## 2013-01-01 DIAGNOSIS — E042 Nontoxic multinodular goiter: Secondary | ICD-10-CM

## 2013-01-22 ENCOUNTER — Ambulatory Visit
Admission: RE | Admit: 2013-01-22 | Discharge: 2013-01-22 | Disposition: A | Discharge status: Home / Self Care | Payer: BC Managed Care – PPO | Source: Ambulatory Visit | Attending: Internal Medicine | Admitting: Internal Medicine

## 2013-01-22 ENCOUNTER — Other Ambulatory Visit (HOSPITAL_COMMUNITY)
Admission: RE | Admit: 2013-01-22 | Discharge: 2013-01-22 | Disposition: A | Discharge status: Home / Self Care | Payer: BC Managed Care – PPO | Source: Ambulatory Visit | Attending: Diagnostic Radiology | Admitting: Diagnostic Radiology

## 2013-01-22 DIAGNOSIS — E041 Nontoxic single thyroid nodule: Secondary | ICD-10-CM

## 2013-01-22 DIAGNOSIS — E042 Nontoxic multinodular goiter: Secondary | ICD-10-CM

## 2013-01-25 ENCOUNTER — Ambulatory Visit (INDEPENDENT_AMBULATORY_CARE_PROVIDER_SITE_OTHER): Payer: BC Managed Care – PPO | Admitting: Family Medicine

## 2013-01-25 ENCOUNTER — Encounter: Payer: Self-pay | Admitting: Family Medicine

## 2013-01-25 VITALS — BP 120/78 | HR 85 | Temp 98.4°F | Wt 251.0 lb

## 2013-01-25 DIAGNOSIS — N509 Disorder of male genital organs, unspecified: Secondary | ICD-10-CM

## 2013-01-25 DIAGNOSIS — N50812 Left testicular pain: Secondary | ICD-10-CM

## 2013-01-25 NOTE — Progress Notes (Signed)
   Subjective:    Patient ID: Chris Perez, male    DOB: [birth year], 38 y.o.   MRN: [id number]  HPI  Patient seen with left testicular discomfort. First noted weeks ago. No rapid growth of any masses. No dysuria. No fevers or chills. No adenopathy. Discomfort is relatively mild. Denies any prior history of epididymitis. He does not take any medications. No alleviating or aggravating factors.  Past Medical History  Diagnosis Date  . Von Willebrands disease     variant; no complications to date  . Herpes zoster     T 9 level over abdomen  . Thyroid disease    Past Surgical History  Procedure Laterality Date  . Wisdom tooth extraction    . Tonsillar abscess i& d  2003  . Appendectomy      Early  1990's     reports that he quit smoking about 4 years ago. He has never used smokeless tobacco. He reports that he drinks about 1.8 ounces of alcohol per week. He reports that he does not use illicit drugs. family history includes Arthritis in his mother; Cancer in his mother and sister; Thyroid disease in his mother and sister; Von Willebrand disease in an other family member. Allergies  Allergen Reactions  . Aspirin     Due to medical condition  (von Willebrand's variant)     Review of Systems  Constitutional: Negative for fever and chills.  Genitourinary: Negative for dysuria.       Objective:   Physical Exam  Constitutional: He appears well-developed and well-nourished.  Cardiovascular: Normal rate and regular rhythm.   Pulmonary/Chest: Effort normal and breath sounds normal. No respiratory distress. He has no wheezes.  Genitourinary:  Testes are normal. He has slightly prominent left epididymis and very small cystic type swelling which is mobile and minimally tender          Assessment & Plan:  Left testicle pain. Suspect either spermatocele or small epididymal cyst. No worrisome findings. Given the fact he has some discomfort we'll set up scrotal ultrasound to further  assess.

## 2013-01-25 NOTE — Progress Notes (Signed)
Pre visit review using our clinic review tool, if applicable. No additional management support is needed unless otherwise documented below in the visit note. 

## 2013-01-25 NOTE — Patient Instructions (Signed)
We will call you with x-ray of scrotum.

## 2013-01-26 ENCOUNTER — Encounter: Payer: Self-pay | Admitting: Internal Medicine

## 2013-01-30 ENCOUNTER — Encounter: Payer: BC Managed Care – PPO | Admitting: Family Medicine

## 2013-02-06 ENCOUNTER — Other Ambulatory Visit: Payer: Self-pay | Admitting: Family Medicine

## 2013-02-06 ENCOUNTER — Other Ambulatory Visit (INDEPENDENT_AMBULATORY_CARE_PROVIDER_SITE_OTHER): Payer: BC Managed Care – PPO

## 2013-02-06 ENCOUNTER — Encounter: Payer: Self-pay | Admitting: Family Medicine

## 2013-02-06 ENCOUNTER — Ambulatory Visit
Admission: RE | Admit: 2013-02-06 | Discharge: 2013-02-06 | Disposition: A | Discharge status: Home / Self Care | Payer: BC Managed Care – PPO | Source: Ambulatory Visit | Attending: Family Medicine | Admitting: Family Medicine

## 2013-02-06 DIAGNOSIS — N50812 Left testicular pain: Secondary | ICD-10-CM

## 2013-02-06 DIAGNOSIS — Z0000 Encounter for general adult medical examination without abnormal findings: Secondary | ICD-10-CM

## 2013-02-06 LAB — POCT URINALYSIS DIPSTICK
BILIRUBIN UA: NEGATIVE
Glucose, UA: NEGATIVE
Ketones, UA: NEGATIVE
Leukocytes, UA: NEGATIVE
Nitrite, UA: NEGATIVE
Protein, UA: NEGATIVE
RBC UA: NEGATIVE
SPEC GRAV UA: 1.015
UROBILINOGEN UA: 0.2
pH, UA: 6

## 2013-02-06 LAB — CBC WITH DIFFERENTIAL/PLATELET
Basophils Absolute: 0 10*3/uL (ref 0.0–0.1)
Basophils Relative: 0.4 % (ref 0.0–3.0)
Eosinophils Absolute: 0 10*3/uL (ref 0.0–0.7)
Eosinophils Relative: 1.4 % (ref 0.0–5.0)
HCT: 40.8 % (ref 39.0–52.0)
HEMOGLOBIN: 14.1 g/dL (ref 13.0–17.0)
LYMPHS PCT: 46.4 % — AB (ref 12.0–46.0)
Lymphs Abs: 1.7 10*3/uL (ref 0.7–4.0)
MCHC: 34.5 g/dL (ref 30.0–36.0)
MCV: 89.2 fl (ref 78.0–100.0)
MONOS PCT: 10 % (ref 3.0–12.0)
Monocytes Absolute: 0.4 10*3/uL (ref 0.1–1.0)
NEUTROS ABS: 1.5 10*3/uL (ref 1.4–7.7)
Neutrophils Relative %: 41.8 % — ABNORMAL LOW (ref 43.0–77.0)
Platelets: 201 10*3/uL (ref 150.0–400.0)
RBC: 4.58 Mil/uL (ref 4.22–5.81)
RDW: 12.9 % (ref 11.5–14.6)
WBC: 3.6 10*3/uL — AB (ref 4.5–10.5)

## 2013-02-06 LAB — BASIC METABOLIC PANEL
BUN: 14 mg/dL (ref 6–23)
CALCIUM: 9.2 mg/dL (ref 8.4–10.5)
CO2: 28 mEq/L (ref 19–32)
Chloride: 103 mEq/L (ref 96–112)
Creatinine, Ser: 1 mg/dL (ref 0.4–1.5)
GFR: 95.11 mL/min (ref >60.00)
GLUCOSE: 86 mg/dL (ref 70–99)
Potassium: 3.7 mEq/L (ref 3.5–5.1)
Sodium: 138 mEq/L (ref 135–145)

## 2013-02-06 LAB — HEPATIC FUNCTION PANEL
ALBUMIN: 4.4 g/dL (ref 3.5–5.2)
ALK PHOS: 47 U/L (ref 39–117)
ALT: 27 U/L (ref 0–53)
AST: 24 U/L (ref 0–37)
Bilirubin, Direct: 0 mg/dL (ref 0.0–0.3)
Total Bilirubin: 0.8 mg/dL (ref 0.3–1.2)
Total Protein: 7.7 g/dL (ref 6.0–8.3)

## 2013-02-06 LAB — LIPID PANEL
CHOL/HDL RATIO: 4
Cholesterol: 163 mg/dL (ref 0–200)
HDL: 37.9 mg/dL — ABNORMAL LOW (ref >39.00)
Triglycerides: 255 mg/dL — ABNORMAL HIGH (ref 0.0–149.0)
VLDL: 51 mg/dL — ABNORMAL HIGH (ref 0.0–40.0)

## 2013-02-06 LAB — LDL CHOLESTEROL, DIRECT: LDL DIRECT: 84.4 mg/dL

## 2013-02-06 LAB — TSH: TSH: 2.65 u[IU]/mL (ref 0.35–5.50)

## 2013-02-14 ENCOUNTER — Ambulatory Visit (INDEPENDENT_AMBULATORY_CARE_PROVIDER_SITE_OTHER): Payer: BC Managed Care – PPO | Admitting: Family Medicine

## 2013-02-14 ENCOUNTER — Encounter: Payer: Self-pay | Admitting: Family Medicine

## 2013-02-14 VITALS — BP 116/78 | HR 78 | Temp 97.8°F | Ht 75.0 in | Wt 248.0 lb

## 2013-02-14 DIAGNOSIS — Z0000 Encounter for general adult medical examination without abnormal findings: Secondary | ICD-10-CM

## 2013-02-14 NOTE — Progress Notes (Signed)
Subjective:    Patient ID: Chris Perez, male    DOB: 27-Jul-1975, 38 y.o.   MRN: [id number]  HPI  Patient seen for complete physical. Has remote history of alcohol abuse. He has been abstinent for quite some time. He quit smoking a few years ago. Somewhat inconsistent exercise. Had previous history of nontoxic multinodular goiter. History of lymphocytic thyroiditis recent thyroid biopsy unremarkable. Recent TSH normal.  He is followed by endocrinologist.  He has an intermittent left knee pain. No effusion. No locking. No giving way.  Past Medical History  Diagnosis Date  . Von Willebrands disease     variant; no complications to date  . Herpes zoster     T 9 level over abdomen  . Thyroid disease    Past Surgical History  Procedure Laterality Date  . Wisdom tooth extraction    . Tonsillar abscess i& d  2003  . Appendectomy      Early  1990's     reports that he quit smoking about 4 years ago. He has never used smokeless tobacco. He reports that he drinks about 1.8 ounces of alcohol per week. He reports that he does not use illicit drugs. family history includes Arthritis in his mother; Cancer in his mother and sister; Thyroid disease in his mother and sister; Von Willebrand disease in an other family member. Allergies  Allergen Reactions  . Aspirin     Due to medical condition  (von Willebrand's variant)     Review of Systems  Constitutional: Negative for fever, activity change, appetite change and fatigue.  HENT: Negative for congestion, ear pain and trouble swallowing.   Eyes: Negative for pain and visual disturbance.  Respiratory: Negative for cough, shortness of breath and wheezing.   Cardiovascular: Negative for chest pain and palpitations.  Gastrointestinal: Negative for nausea, vomiting, abdominal pain, diarrhea, constipation, blood in stool, abdominal distention and rectal pain.  Genitourinary: Negative for dysuria, hematuria and testicular pain.    Musculoskeletal: Negative for arthralgias and joint swelling.  Skin: Negative for rash.  Neurological: Negative for dizziness, syncope and headaches.  Hematological: Negative for adenopathy.  Psychiatric/Behavioral: Negative for confusion and dysphoric mood.       Objective:   Physical Exam  Constitutional: He is oriented to person, place, and time. He appears well-developed and well-nourished. No distress.  HENT:  Head: Normocephalic and atraumatic.  Right Ear: External ear normal.  Left Ear: External ear normal.  Mouth/Throat: Oropharynx is clear and moist.  Eyes: Conjunctivae and EOM are normal. Pupils are equal, round, and reactive to light.  Neck: Normal range of motion. Neck supple. No thyromegaly present.  Cardiovascular: Normal rate, regular rhythm and normal heart sounds.   No murmur heard. Pulmonary/Chest: No respiratory distress. He has no wheezes. He has no rales.  Abdominal: Soft. Bowel sounds are normal. He exhibits no distension and no mass. There is no tenderness. There is no rebound and no guarding.  Musculoskeletal: He exhibits no edema.  Left knee - no effusion. Full range of motion. No localized tenderness. No warmth. No erythema. Ligament testing is normal.  Lymphadenopathy:    He has no cervical adenopathy.  Neurological: He is alert and oriented to person, place, and time. He displays normal reflexes. No cranial nerve deficit.  Skin: No rash noted.  Psychiatric: He has a normal mood and affect.          Assessment & Plan:  Complete physical. Labs reviewed. Has dyslipidemia - high triglycerides and low  HDL. Handout given on triglyceridemia. Establish more consistent exercise. If knee pain recurs consider sports medicine referral

## 2013-02-14 NOTE — Progress Notes (Signed)
Pre visit review using our clinic review tool, if applicable. No additional management support is needed unless otherwise documented below in the visit note. 

## 2013-02-14 NOTE — Patient Instructions (Signed)
Hypertriglyceridemia  Diet for High blood levels of Triglycerides Most fats in food are triglycerides. Triglycerides in your blood are stored as fat in your body. High levels of triglycerides in your blood may put you at a greater risk for heart disease and stroke.  Normal triglyceride levels are less than 150 mg/dL. Borderline high levels are 150-199 mg/dl. High levels are 200 - 499 mg/dL, and very high triglyceride levels are greater than 500 mg/dL. The decision to treat high triglycerides is generally based on the level. For people with borderline or high triglyceride levels, treatment includes weight loss and exercise. Drugs are recommended for people with very high triglyceride levels. Many people who need treatment for high triglyceride levels have metabolic syndrome. This syndrome is a collection of disorders that often include: insulin resistance, high blood pressure, blood clotting problems, high cholesterol and triglycerides. TESTING PROCEDURE FOR TRIGLYCERIDES  You should not eat 4 hours before getting your triglycerides measured. The normal range of triglycerides is between 10 and 250 milligrams per deciliter (mg/dl). Some people may have extreme levels (1000 or above), but your triglyceride level may be too high if it is above 150 mg/dl, depending on what other risk factors you have for heart disease.  People with high blood triglycerides may also have high blood cholesterol levels. If you have high blood cholesterol as well as high blood triglycerides, your risk for heart disease is probably greater than if you only had high triglycerides. High blood cholesterol is one of the main risk factors for heart disease. CHANGING YOUR DIET  Your weight can affect your blood triglyceride level. If you are more than 20% above your ideal body weight, you may be able to lower your blood triglycerides by losing weight. Eating less and exercising regularly is the best way to combat this. Fat provides more  calories than any other food. The best way to lose weight is to eat less fat. Only 30% of your total calories should come from fat. Less than 7% of your diet should come from saturated fat. A diet low in fat and saturated fat is the same as a diet to decrease blood cholesterol. By eating a diet lower in fat, you may lose weight, lower your blood cholesterol, and lower your blood triglyceride level.  Eating a diet low in fat, especially saturated fat, may also help you lower your blood triglyceride level. Ask your dietitian to help you figure how much fat you can eat based on the number of calories your caregiver has prescribed for you.  Exercise, in addition to helping with weight loss may also help lower triglyceride levels.   Alcohol can increase blood triglycerides. You may need to stop drinking alcoholic beverages.  Too much carbohydrate in your diet may also increase your blood triglycerides. Some complex carbohydrates are necessary in your diet. These may include bread, rice, potatoes, other starchy vegetables and cereals.  Reduce "simple" carbohydrates. These may include pure sugars, candy, honey, and jelly without losing other nutrients. If you have the kind of high blood triglycerides that is affected by the amount of carbohydrates in your diet, you will need to eat less sugar and less high-sugar foods. Your caregiver can help you with this.  Adding 2-4 grams of fish oil (EPA+ DHA) may also help lower triglycerides. Speak with your caregiver before adding any supplements to your regimen. Following the Diet  Maintain your ideal weight. Your caregivers can help you with a diet. Generally, eating less food and getting more   exercise will help you lose weight. Joining a weight control group may also help. Ask your caregivers for a good weight control group in your area.  Eat low-fat foods instead of high-fat foods. This can help you lose weight too.  These foods are lower in fat. Eat MORE of these:    Dried beans, peas, and lentils.  Egg whites.  Low-fat cottage cheese.  Fish.  Lean cuts of meat, such as round, sirloin, rump, and flank (cut extra fat off meat you fix).  Whole grain breads, cereals and pasta.  Skim and nonfat dry milk.  Low-fat yogurt.  Poultry without the skin.  Cheese made with skim or part-skim milk, such as mozzarella, parmesan, farmers', ricotta, or pot cheese. These are higher fat foods. Eat LESS of these:   Whole milk and foods made from whole milk, such as American, blue, cheddar, monterey jack, and swiss cheese  High-fat meats, such as luncheon meats, sausages, knockwurst, bratwurst, hot dogs, ribs, corned beef, ground pork, and regular ground beef.  Fried foods. Limit saturated fats in your diet. Substituting unsaturated fat for saturated fat may decrease your blood triglyceride level. You will need to read package labels to know which products contain saturated fats.  These foods are high in saturated fat. Eat LESS of these:   Fried pork skins.  Whole milk.  Skin and fat from poultry.  Palm oil.  Butter.  Shortening.  Cream cheese.  Bacon.  Margarines and baked goods made from listed oils.  Vegetable shortenings.  Chitterlings.  Fat from meats.  Coconut oil.  Palm kernel oil.  Lard.  Cream.  Sour cream.  Fatback.  Coffee whiteners and non-dairy creamers made with these oils.  Cheese made from whole milk. Use unsaturated fats (both polyunsaturated and monounsaturated) moderately. Remember, even though unsaturated fats are better than saturated fats; you still want a diet low in total fat.  These foods are high in unsaturated fat:   Canola oil.  Sunflower oil.  Mayonnaise.  Almonds.  Peanuts.  Pine nuts.  Margarines made with these oils.  Safflower oil.  Olive oil.  Avocados.  Cashews.  Peanut butter.  Sunflower seeds.  Soybean oil.  Peanut  oil.  Olives.  Pecans.  Walnuts.  Pumpkin seeds. Avoid sugar and other high-sugar foods. This will decrease carbohydrates without decreasing other nutrients. Sugar in your food goes rapidly to your blood. When there is excess sugar in your blood, your liver may use it to make more triglycerides. Sugar also contains calories without other important nutrients.  Eat LESS of these:   Sugar, brown sugar, powdered sugar, jam, jelly, preserves, honey, syrup, molasses, pies, candy, cakes, cookies, frosting, pastries, colas, soft drinks, punches, fruit drinks, and regular gelatin.  Avoid alcohol. Alcohol, even more than sugar, may increase blood triglycerides. In addition, alcohol is high in calories and low in nutrients. Ask for sparkling water, or a diet soft drink instead of an alcoholic beverage. Suggestions for planning and preparing meals   Bake, broil, grill or roast meats instead of frying.  Remove fat from meats and skin from poultry before cooking.  Add spices, herbs, lemon juice or vinegar to vegetables instead of salt, rich sauces or gravies.  Use a non-stick skillet without fat or use no-stick sprays.  Cool and refrigerate stews and broth. Then remove the hardened fat floating on the surface before serving.  Refrigerate meat drippings and skim off fat to make low-fat gravies.  Serve more fish.  Use less butter,   margarine and other high-fat spreads on bread or vegetables.  Use skim or reconstituted non-fat dry milk for cooking.  Cook with low-fat cheeses.  Substitute low-fat yogurt or cottage cheese for all or part of the sour cream in recipes for sauces, dips or congealed salads.  Use half yogurt/half mayonnaise in salad recipes.  Substitute evaporated skim milk for cream. Evaporated skim milk or reconstituted non-fat dry milk can be whipped and substituted for whipped cream in certain recipes.  Choose fresh fruits for dessert instead of high-fat foods such as pies or  cakes. Fruits are naturally low in fat. When Dining Out   Order low-fat appetizers such as fruit or vegetable juice, pasta with vegetables or tomato sauce.  Select clear, rather than cream soups.  Ask that dressings and gravies be served on the side. Then use less of them.  Order foods that are baked, broiled, poached, steamed, stir-fried, or roasted.  Ask for margarine instead of butter, and use only a small amount.  Drink sparkling water, unsweetened tea or coffee, or diet soft drinks instead of alcohol or other sweet beverages. QUESTIONS AND ANSWERS ABOUT OTHER FATS IN THE BLOOD: SATURATED FAT, TRANS FAT, AND CHOLESTEROL What is trans fat? Trans fat is a type of fat that is formed when vegetable oil is hardened through a process called hydrogenation. This process helps makes foods more solid, gives them shape, and prolongs their shelf life. Trans fats are also called hydrogenated or partially hydrogenated oils.  What do saturated fat, trans fat, and cholesterol in foods have to do with heart disease? Saturated fat, trans fat, and cholesterol in the diet all raise the level of LDL "bad" cholesterol in the blood. The higher the LDL cholesterol, the greater the risk for coronary heart disease (CHD). Saturated fat and trans fat raise LDL similarly.  What foods contain saturated fat, trans fat, and cholesterol? High amounts of saturated fat are found in animal products, such as fatty cuts of meat, chicken skin, and full-fat dairy products like butter, whole milk, cream, and cheese, and in tropical vegetable oils such as palm, palm kernel, and coconut oil. Trans fat is found in some of the same foods as saturated fat, such as vegetable shortening, some margarines (especially hard or stick margarine), crackers, cookies, baked goods, fried foods, salad dressings, and other processed foods made with partially hydrogenated vegetable oils. Small amounts of trans fat also occur naturally in some animal  products, such as milk products, beef, and lamb. Foods high in cholesterol include liver, other organ meats, egg yolks, shrimp, and full-fat dairy products. How can I use the new food label to make heart-healthy food choices? Check the Nutrition Facts panel of the food label. Choose foods lower in saturated fat, trans fat, and cholesterol. For saturated fat and cholesterol, you can also use the Percent Daily Value (%DV): 5% DV or less is low, and 20% DV or more is high. (There is no %DV for trans fat.) Use the Nutrition Facts panel to choose foods low in saturated fat and cholesterol, and if the trans fat is not listed, read the ingredients and limit products that list shortening or hydrogenated or partially hydrogenated vegetable oil, which tend to be high in trans fat. POINTS TO REMEMBER:   Discuss your risk for heart disease with your caregivers, and take steps to reduce risk factors.  Change your diet. Choose foods that are low in saturated fat, trans fat, and cholesterol.  Add exercise to your daily routine if   it is not already being done. Participate in physical activity of moderate intensity, like brisk walking, for at least 30 minutes on most, and preferably all days of the week. No time? Break the 30 minutes into three, 10-minute segments during the day.  Stop smoking. If you do smoke, contact your caregiver to discuss ways in which they can help you quit.  Do not use street drugs.  Maintain a normal weight.  Maintain a healthy blood pressure.  Keep up with your blood work for checking the fats in your blood as directed by your caregiver. Document Released: 10/22/2003 Document Revised: 07/05/2011 Document Reviewed: 05/19/2008 [employer] Patient Information 2014 [employer]  

## 2013-03-15 ENCOUNTER — Ambulatory Visit (INDEPENDENT_AMBULATORY_CARE_PROVIDER_SITE_OTHER): Payer: BC Managed Care – PPO | Admitting: Family Medicine

## 2013-03-15 ENCOUNTER — Encounter: Payer: Self-pay | Admitting: Family Medicine

## 2013-03-15 VITALS — BP 122/80 | HR 96 | Temp 97.7°F | Wt 243.0 lb

## 2013-03-15 DIAGNOSIS — B9789 Other viral agents as the cause of diseases classified elsewhere: Secondary | ICD-10-CM

## 2013-03-15 DIAGNOSIS — B349 Viral infection, unspecified: Secondary | ICD-10-CM

## 2013-03-15 NOTE — Progress Notes (Signed)
   Subjective:    Patient ID: Chris Perez, male    DOB: 1975-10-16, 38 y.o.   MRN: [id number]  Cough Associated symptoms include chills, myalgias and postnasal drip. Pertinent negatives include no fever, headaches or rash.      Review of Systems  Constitutional: Positive for chills. Negative for fever.  HENT: Positive for congestion and postnasal drip.   Respiratory: Positive for cough.   Musculoskeletal: Positive for myalgias.  Skin: Negative for rash.  Neurological: Negative for headaches.       Objective:   Physical Exam  Constitutional: He appears well-developed and well-nourished.          Assessment & Plan:

## 2013-03-15 NOTE — Patient Instructions (Signed)

## 2013-03-15 NOTE — Progress Notes (Signed)
   Subjective:    Patient ID: Chris Perez, male    DOB: [birth year], 38 y.o.   MRN: [id number]  Cough Associated symptoms include chills and postnasal drip. Pertinent negatives include no fever or headaches.   Acute visit. Patient had onset 1 week ago of cough, sore throat, nasal congestion, body aches. No fever. He initially had some chills but none now. He denies any nausea or vomiting. He's had some increased fatigue. He is using over-the-counter medications which have helped relieve his cough. No sick exposures. Patient nonsmoker.  Past Medical History  Diagnosis Date  . Von Willebrands disease     variant; no complications to date  . Herpes zoster     T 9 level over abdomen  . Thyroid disease    Past Surgical History  Procedure Laterality Date  . Wisdom tooth extraction    . Tonsillar abscess i& d  2003  . Appendectomy      Early  1990's     reports that he quit smoking about 4 years ago. He has never used smokeless tobacco. He reports that he drinks about 1.8 ounces of alcohol per week. He reports that he does not use illicit drugs. family history includes Arthritis in his mother; Cancer in his mother and sister; Thyroid disease in his mother and sister; Von Willebrand disease in an other family member. Allergies  Allergen Reactions  . Aspirin     Due to medical condition  (von Willebrand's variant)      Review of Systems  Constitutional: Positive for chills. Negative for fever.  HENT: Positive for congestion and postnasal drip.   Respiratory: Positive for cough.   Neurological: Negative for headaches.       Objective:   Physical Exam  Constitutional: He appears well-developed and well-nourished.  HENT:  Right Ear: External ear normal.  Left Ear: External ear normal.  Mouth/Throat: Oropharynx is clear and moist.  Neck: Neck supple.  Cardiovascular: Normal rate and regular rhythm.   Pulmonary/Chest: Effort normal and breath sounds normal. No respiratory  distress. He has no wheezes. He has no rales.  Lymphadenopathy:    He has no cervical adenopathy.          Assessment & Plan:  Viral syndrome. Reassurance. Continue over-the-counter medications. His exam is nonfocal. Followup promptly for any fever or worsening symptoms

## 2013-03-15 NOTE — Progress Notes (Signed)
Pre visit review using our clinic review tool, if applicable. No additional management support is needed unless otherwise documented below in the visit note. 

## 2013-09-11 ENCOUNTER — Ambulatory Visit (INDEPENDENT_AMBULATORY_CARE_PROVIDER_SITE_OTHER): Payer: BC Managed Care – PPO | Admitting: Family Medicine

## 2013-09-11 ENCOUNTER — Encounter: Payer: Self-pay | Admitting: Family Medicine

## 2013-09-11 VITALS — BP 128/72 | HR 71 | Temp 98.0°F | Wt 239.0 lb

## 2013-09-11 DIAGNOSIS — H8110 Benign paroxysmal vertigo, unspecified ear: Secondary | ICD-10-CM

## 2013-09-11 NOTE — Patient Instructions (Signed)

## 2013-09-11 NOTE — Progress Notes (Signed)
Pre visit review using our clinic review tool, if applicable. No additional management support is needed unless otherwise documented below in the visit note. 

## 2013-09-11 NOTE — Progress Notes (Signed)
   Subjective:    Patient ID: Chris Perez, male    DOB: October 15, 1975, 38 y.o.   MRN: [id number]  Dizziness Pertinent negatives include no chest pain, chills, congestion, fatigue, fever or headaches.   Patient seen with dizziness off and on for the past couple days. Denies any lightheadedness. No syncope. This sounds more like this disequilibrium and vertigo. No clear triggers. Denies any headache. No hearing changes. No nausea or vomiting. No recent nasal congestive symptoms. He denies history of similar symptoms the past. He is currently taking no medications. History of multinodular goiter.  Past Medical History  Diagnosis Date  . Von Willebrands disease     variant; no complications to date  . Herpes zoster     T 9 level over abdomen  . Thyroid disease    Past Surgical History  Procedure Laterality Date  . Wisdom tooth extraction    . Tonsillar abscess i& d  2003  . Appendectomy      Early  1990's     reports that he quit smoking about 5 years ago. He has never used smokeless tobacco. He reports that he drinks about 1.8 ounces of alcohol per week. He reports that he does not use illicit drugs. family history includes Arthritis in his mother; Cancer in his mother and sister; Thyroid disease in his mother and sister; Von Willebrand disease in an other family member. Allergies  Allergen Reactions  . Aspirin     Due to medical condition  (von Willebrand's variant)      Review of Systems  Constitutional: Negative for fever, chills, appetite change, fatigue and unexpected weight change.  HENT: Negative for congestion and hearing loss.   Eyes: Negative for visual disturbance.  Respiratory: Negative for shortness of breath.   Cardiovascular: Negative for chest pain.  Neurological: Positive for dizziness. Negative for headaches.  Psychiatric/Behavioral: Negative for confusion.       Objective:   Physical Exam  Constitutional: He is oriented to person, place, and time. He  appears well-developed and well-nourished. No distress.  Cardiovascular: Normal rate and regular rhythm.   Pulmonary/Chest: Effort normal and breath sounds normal. No respiratory distress. He has no wheezes. He has no rales.  Neurological: He is alert and oriented to person, place, and time. No cranial nerve deficit.  Cerebellar functions normal. His symptoms are not reproduced with change of position from sitting to supine. No nystagmus. No focal weakness. Gait is normal          Assessment & Plan:  Vertigo. Suspect benign peripheral positional vertigo. He does not have any red flags for more worrisome cause of vertigo and neuro exam is stable at this time. We recommend observation. Consider referral for vestibular rehabilitation if symptoms persist.

## 2014-04-14 ENCOUNTER — Other Ambulatory Visit: Payer: BC Managed Care – PPO

## 2014-04-15 ENCOUNTER — Other Ambulatory Visit (INDEPENDENT_AMBULATORY_CARE_PROVIDER_SITE_OTHER): Payer: BC Managed Care – PPO

## 2014-04-15 DIAGNOSIS — Z0000 Encounter for general adult medical examination without abnormal findings: Secondary | ICD-10-CM

## 2014-04-15 DIAGNOSIS — R7989 Other specified abnormal findings of blood chemistry: Secondary | ICD-10-CM | POA: Diagnosis not present

## 2014-04-15 LAB — CBC WITH DIFFERENTIAL/PLATELET
BASOS PCT: 0.6 % (ref 0.0–3.0)
Basophils Absolute: 0 10*3/uL (ref 0.0–0.1)
EOS PCT: 1.2 % (ref 0.0–5.0)
Eosinophils Absolute: 0 10*3/uL (ref 0.0–0.7)
HEMATOCRIT: 41.6 % (ref 39.0–52.0)
HEMOGLOBIN: 14.7 g/dL (ref 13.0–17.0)
Lymphocytes Relative: 35.4 % (ref 12.0–46.0)
Lymphs Abs: 1.3 10*3/uL (ref 0.7–4.0)
MCHC: 35.2 g/dL (ref 30.0–36.0)
MCV: 88.5 fl (ref 78.0–100.0)
Monocytes Absolute: 0.4 10*3/uL (ref 0.1–1.0)
Monocytes Relative: 11.6 % (ref 3.0–12.0)
NEUTROS PCT: 51.2 % (ref 43.0–77.0)
Neutro Abs: 1.9 10*3/uL (ref 1.4–7.7)
Platelets: 223 10*3/uL (ref 150.0–400.0)
RBC: 4.71 Mil/uL (ref 4.22–5.81)
RDW: 13 % (ref 11.5–15.5)
WBC: 3.7 10*3/uL — ABNORMAL LOW (ref 4.0–10.5)

## 2014-04-15 LAB — HEPATIC FUNCTION PANEL
ALT: 20 U/L (ref 0–53)
AST: 19 U/L (ref 0–37)
Albumin: 4.3 g/dL (ref 3.5–5.2)
Alkaline Phosphatase: 44 U/L (ref 39–117)
BILIRUBIN TOTAL: 0.6 mg/dL (ref 0.2–1.2)
Bilirubin, Direct: 0.1 mg/dL (ref 0.0–0.3)
Total Protein: 7.4 g/dL (ref 6.0–8.3)

## 2014-04-15 LAB — BASIC METABOLIC PANEL
BUN: 16 mg/dL (ref 6–23)
CALCIUM: 9.4 mg/dL (ref 8.4–10.5)
CHLORIDE: 102 meq/L (ref 96–112)
CO2: 29 mEq/L (ref 19–32)
CREATININE: 0.91 mg/dL (ref 0.40–1.50)
GFR: 99.31 mL/min (ref >60.00)
GLUCOSE: 97 mg/dL (ref 70–99)
POTASSIUM: 3.9 meq/L (ref 3.5–5.1)
SODIUM: 138 meq/L (ref 135–145)

## 2014-04-15 LAB — LIPID PANEL
CHOLESTEROL: 188 mg/dL (ref 0–200)
HDL: 38.1 mg/dL — ABNORMAL LOW (ref >39.00)
NONHDL: 149.9
Total CHOL/HDL Ratio: 5
Triglycerides: 291 mg/dL — ABNORMAL HIGH (ref 0.0–149.0)
VLDL: 58.2 mg/dL — AB (ref 0.0–40.0)

## 2014-04-15 LAB — LDL CHOLESTEROL, DIRECT: Direct LDL: 91 mg/dL

## 2014-04-15 LAB — TSH: TSH: 7.45 u[IU]/mL — AB (ref 0.35–4.50)

## 2014-04-17 ENCOUNTER — Encounter: Payer: Self-pay | Admitting: Family Medicine

## 2014-04-17 ENCOUNTER — Ambulatory Visit (INDEPENDENT_AMBULATORY_CARE_PROVIDER_SITE_OTHER): Payer: BC Managed Care – PPO | Admitting: Family Medicine

## 2014-04-17 VITALS — BP 120/80 | HR 78 | Temp 98.6°F | Ht 75.0 in | Wt 240.0 lb

## 2014-04-17 DIAGNOSIS — R7989 Other specified abnormal findings of blood chemistry: Secondary | ICD-10-CM | POA: Diagnosis not present

## 2014-04-17 DIAGNOSIS — Z0000 Encounter for general adult medical examination without abnormal findings: Secondary | ICD-10-CM | POA: Diagnosis not present

## 2014-04-17 NOTE — Progress Notes (Signed)
Subjective:    Patient ID: Chris Perez, male    DOB: 07-Sep-1975, 39 y.o.   MRN: [id number]  HPI Patient seen for complete physical. Generally very healthy. He's had prior history of abnormal TSH but never treated. He was followed by endocrinology for some time and eventually his levels normalized. He's not had any recent fatigue or constipation issues. He has history of mildly elevated triglycerides and low HDL. He exercises somewhat inconsistently. Poor dietary compliance at times. Takes no regular medications. Tetanus up-to-date.  Chronic issue with urine frequency. No dry mouth. No burning with urination. No obstructive urinary symptoms. No regular caffeine or alcohol use.  Past Medical History  Diagnosis Date  . Von Willebrands disease     variant; no complications to date  . Herpes zoster     T 9 level over abdomen  . Thyroid disease    Past Surgical History  Procedure Laterality Date  . Wisdom tooth extraction    . Tonsillar abscess i& d  2003  . Appendectomy      Early  1990's     reports that he quit smoking about 6 years ago. He has never used smokeless tobacco. He reports that he drinks about 1.8 oz of alcohol per week. He reports that he does not use illicit drugs. family history includes Arthritis in his mother; Cancer in his mother and sister; Thyroid disease in his mother and sister; Von Willebrand disease in an other family member. Allergies  Allergen Reactions  . Aspirin     Due to medical condition  (von Willebrand's variant)      Review of Systems  Constitutional: Negative for fever, activity change, appetite change and fatigue.  HENT: Negative for congestion, ear pain and trouble swallowing.   Eyes: Negative for pain and visual disturbance.  Respiratory: Negative for cough, shortness of breath and wheezing.   Cardiovascular: Negative for chest pain and palpitations.  Gastrointestinal: Negative for nausea, vomiting, abdominal pain, diarrhea,  constipation, blood in stool, abdominal distention and rectal pain.  Genitourinary: Positive for frequency. Negative for dysuria, hematuria and testicular pain.  Musculoskeletal: Negative for joint swelling and arthralgias.  Skin: Negative for rash.  Neurological: Negative for dizziness, syncope and headaches.  Hematological: Negative for adenopathy.  Psychiatric/Behavioral: Negative for confusion and dysphoric mood.       Objective:   Physical Exam  Constitutional: He is oriented to person, place, and time. He appears well-developed and well-nourished. No distress.  HENT:  Head: Normocephalic and atraumatic.  Right Ear: External ear normal.  Left Ear: External ear normal.  Mouth/Throat: Oropharynx is clear and moist.  Eyes: Conjunctivae and EOM are normal. Pupils are equal, round, and reactive to light.  Neck: Normal range of motion. Neck supple. No thyromegaly present.  Cardiovascular: Normal rate, regular rhythm and normal heart sounds.   No murmur heard. Pulmonary/Chest: No respiratory distress. He has no wheezes. He has no rales.  Abdominal: Soft. Bowel sounds are normal. He exhibits no distension and no mass. There is no tenderness. There is no rebound and no guarding.  Genitourinary: Rectum normal and prostate normal.  Musculoskeletal: He exhibits no edema.  Lymphadenopathy:    He has no cervical adenopathy.  Neurological: He is alert and oriented to person, place, and time. He displays normal reflexes. No cranial nerve deficit.  Skin: No rash noted.  Psychiatric: He has a normal mood and affect.          Assessment & Plan:  Complete physical. Labs  reviewed. He has high triglyceride and low HDL. Handout on hypertriglyceridemia given. Establish more consistent exercise. Increase omega-3 intake. He has mildly elevated TSH. Asymptomatic. Repeat TSH and free T4 in about 4 months

## 2014-04-17 NOTE — Progress Notes (Signed)
Pre visit review using our clinic review tool, if applicable. No additional management support is needed unless otherwise documented below in the visit note. 

## 2014-04-17 NOTE — Patient Instructions (Signed)

## 2014-10-02 ENCOUNTER — Ambulatory Visit: Payer: BC Managed Care – PPO | Admitting: Adult Health

## 2015-07-14 ENCOUNTER — Other Ambulatory Visit (INDEPENDENT_AMBULATORY_CARE_PROVIDER_SITE_OTHER): Payer: BC Managed Care – PPO

## 2015-07-14 DIAGNOSIS — Z0000 Encounter for general adult medical examination without abnormal findings: Secondary | ICD-10-CM | POA: Diagnosis not present

## 2015-07-14 LAB — CBC WITH DIFFERENTIAL/PLATELET
BASOS PCT: 0.7 % (ref 0.0–3.0)
Basophils Absolute: 0 10*3/uL (ref 0.0–0.1)
EOS ABS: 0.1 10*3/uL (ref 0.0–0.7)
Eosinophils Relative: 1.6 % (ref 0.0–5.0)
HEMATOCRIT: 40.3 % (ref 39.0–52.0)
HEMOGLOBIN: 13.8 g/dL (ref 13.0–17.0)
LYMPHS PCT: 38.9 % (ref 12.0–46.0)
Lymphs Abs: 1.3 10*3/uL (ref 0.7–4.0)
MCHC: 34.1 g/dL (ref 30.0–36.0)
MCV: 90 fl (ref 78.0–100.0)
Monocytes Absolute: 0.4 10*3/uL (ref 0.1–1.0)
Monocytes Relative: 11.9 % (ref 3.0–12.0)
Neutro Abs: 1.6 10*3/uL (ref 1.4–7.7)
Neutrophils Relative %: 46.9 % (ref 43.0–77.0)
Platelets: 240 10*3/uL (ref 150.0–400.0)
RBC: 4.48 Mil/uL (ref 4.22–5.81)
RDW: 12.8 % (ref 11.5–15.5)
WBC: 3.3 10*3/uL — AB (ref 4.0–10.5)

## 2015-07-14 LAB — HEPATIC FUNCTION PANEL
ALT: 14 U/L (ref 0–53)
AST: 16 U/L (ref 0–37)
Albumin: 4.4 g/dL (ref 3.5–5.2)
Alkaline Phosphatase: 55 U/L (ref 39–117)
Bilirubin, Direct: 0.1 mg/dL (ref 0.0–0.3)
TOTAL PROTEIN: 7.7 g/dL (ref 6.0–8.3)
Total Bilirubin: 0.5 mg/dL (ref 0.2–1.2)

## 2015-07-14 LAB — BASIC METABOLIC PANEL
BUN: 15 mg/dL (ref 6–23)
CO2: 31 mEq/L (ref 19–32)
CREATININE: 0.88 mg/dL (ref 0.40–1.50)
Calcium: 9.5 mg/dL (ref 8.4–10.5)
Chloride: 104 mEq/L (ref 96–112)
GFR: 102.54 mL/min (ref >60.00)
Glucose, Bld: 88 mg/dL (ref 70–99)
Potassium: 3.6 mEq/L (ref 3.5–5.1)
Sodium: 141 mEq/L (ref 135–145)

## 2015-07-14 LAB — LIPID PANEL
Cholesterol: 163 mg/dL (ref 0–200)
HDL: 42.3 mg/dL (ref >39.00)
LDL Cholesterol: 91 mg/dL (ref 0–99)
NONHDL: 120.27
Total CHOL/HDL Ratio: 4
Triglycerides: 144 mg/dL (ref 0.0–149.0)
VLDL: 28.8 mg/dL (ref 0.0–40.0)

## 2015-07-14 LAB — TSH: TSH: 5.91 u[IU]/mL — AB (ref 0.35–4.50)

## 2015-07-27 ENCOUNTER — Encounter: Payer: BC Managed Care – PPO | Admitting: Family Medicine

## 2015-08-04 ENCOUNTER — Encounter: Payer: Self-pay | Admitting: Family Medicine

## 2015-08-04 ENCOUNTER — Ambulatory Visit (INDEPENDENT_AMBULATORY_CARE_PROVIDER_SITE_OTHER): Payer: BC Managed Care – PPO | Admitting: Family Medicine

## 2015-08-04 VITALS — BP 110/84 | HR 93 | Temp 98.4°F | Ht 75.0 in | Wt 231.7 lb

## 2015-08-04 DIAGNOSIS — Z0000 Encounter for general adult medical examination without abnormal findings: Secondary | ICD-10-CM

## 2015-08-04 NOTE — Progress Notes (Signed)
Subjective:     Patient ID: Chris Perez, male   DOB: 06-18-1975, 40 y.o.   MRN: [id number]  HPI   Physical exam. Patient has history of multinodular goiter-nontoxic. Few years ago had elevated TSH around 36 but since then this has been down. He has declined replacement therapy. Takes no medications. Mother and sister have hypothyroidism.  Has lost some weight GERD past year due to his efforts. Overall feels well with no fatigue issues. Tetanus up-to-date.  Past Medical History  Diagnosis Date  . Von Willebrands disease ([place])     variant; no complications to date  . Herpes zoster     T 9 level over abdomen  . Thyroid disease    Past Surgical History  Procedure Laterality Date  . Wisdom tooth extraction    . Tonsillar abscess i& d  2003  . Appendectomy      Early  1990's     reports that he quit smoking about 7 years ago. He has never used smokeless tobacco. He reports that he drinks about 1.8 oz of alcohol per week. He reports that he does not use illicit drugs. family history includes Arthritis in his mother; Cancer in his mother and sister; Thyroid disease in his mother and sister. Allergies  Allergen Reactions  . Aspirin     Due to medical condition  (von Willebrand's variant)     Review of Systems  Constitutional: Negative for fever, activity change, appetite change, fatigue and unexpected weight change.  HENT: Negative for congestion, ear pain and trouble swallowing.   Eyes: Negative for pain and visual disturbance.  Respiratory: Negative for cough, chest tightness, shortness of breath and wheezing.   Cardiovascular: Negative for chest pain, palpitations and leg swelling.  Gastrointestinal: Negative for nausea, vomiting, abdominal pain, diarrhea, constipation, blood in stool, abdominal distention and rectal pain.  Endocrine: Negative for polydipsia and polyuria.  Genitourinary: Negative for dysuria, hematuria and testicular pain.  Musculoskeletal: Negative for  joint swelling and arthralgias.  Skin: Negative for rash.  Neurological: Negative for dizziness, syncope, weakness, light-headedness and headaches.  Hematological: Negative for adenopathy.  Psychiatric/Behavioral: Negative for confusion and dysphoric mood.       Objective:   Physical Exam  Constitutional: He is oriented to person, place, and time. He appears well-developed and well-nourished. No distress.  HENT:  Head: Normocephalic and atraumatic.  Right Ear: External ear normal.  Left Ear: External ear normal.  Mouth/Throat: Oropharynx is clear and moist.  Eyes: Conjunctivae and EOM are normal. Pupils are equal, round, and reactive to light.  Neck: Normal range of motion. Neck supple. No thyromegaly present.  Cardiovascular: Normal rate, regular rhythm and normal heart sounds.   No murmur heard. Pulmonary/Chest: No respiratory distress. He has no wheezes. He has no rales.  Abdominal: Soft. Bowel sounds are normal. He exhibits no distension and no mass. There is no tenderness. There is no rebound and no guarding.  Musculoskeletal: He exhibits no edema.  Lymphadenopathy:    He has no cervical adenopathy.  Neurological: He is alert and oriented to person, place, and time. He displays normal reflexes. No cranial nerve deficit.  Skin: No rash noted.  Small cut on ventral surface right great toe. No evidence for secondary infection. No drainage. No cellulitis changes.  Psychiatric: He has a normal mood and affect.       Assessment:     Physical exam. Immunizations up-to-date  Mildly elevated TSH. Probably subclinical hypothyroidism. He has no major symptoms of hypothyroidism  Plan:     Continue weight loss efforts. Consider free T3 and free T4 with TSH with labs next year for physical.  Eulas Post MD [facility] at [facility]

## 2015-08-04 NOTE — Progress Notes (Signed)
Pre visit review using our clinic review tool, if applicable. No additional management support is needed unless otherwise documented below in the visit note. 

## 2015-11-16 ENCOUNTER — Encounter: Payer: Self-pay | Admitting: Family Medicine

## 2015-11-16 ENCOUNTER — Ambulatory Visit (INDEPENDENT_AMBULATORY_CARE_PROVIDER_SITE_OTHER): Payer: BC Managed Care – PPO | Admitting: Family Medicine

## 2015-11-16 VITALS — BP 100/80 | HR 92 | Temp 98.2°F | Ht 75.0 in | Wt 231.0 lb

## 2015-11-16 DIAGNOSIS — N5082 Scrotal pain: Secondary | ICD-10-CM

## 2015-11-16 NOTE — Progress Notes (Signed)
Subjective:     Patient ID: Chris Perez, male   DOB: December 06, 1975, 40 y.o.   MRN: [id number]  HPI Patient seen with left scrotal pain. Pain is actually above/superior to the testicle region.  He apparently had similar type pain couple years ago and had scrotum ultrasound January 2015 which showed small bilateral hydroceles, otherwise normal. Denies injury. Pain started off relatively intermittent but becoming more constant. Sharp quality pain relatively mild. 5 out of 10 intensity at its worst. Occasional radiation up toward the groin region and consistently left-sided. No dysuria. No penile discharge. No adenopathy noted. Not palpated any masses. No exacerbating or alleviating factors. No appetite or weight changes. No fevers or chills.  Past Medical History:  Diagnosis Date  . Herpes zoster    T 9 level over abdomen  . Thyroid disease   . Von Willebrands disease ([place])    variant; no complications to date   Past Surgical History:  Procedure Laterality Date  . APPENDECTOMY     Early  1990's   . tonsillar abscess I& D  2003  . WISDOM TOOTH EXTRACTION      reports that he quit smoking about 7 years ago. He has never used smokeless tobacco. He reports that he drinks about 1.8 oz of alcohol per week . He reports that he does not use drugs. family history includes Arthritis in his mother; Cancer in his mother and sister; Thyroid disease in his mother and sister. Allergies  Allergen Reactions  . Aspirin     Due to medical condition  (von Willebrand's variant)     Review of Systems  Constitutional: Negative for appetite change, chills and fever.  Genitourinary: Positive for testicular pain. Negative for discharge, dysuria, flank pain, frequency, genital sores, hematuria, penile pain and scrotal swelling.  Hematological: Negative for adenopathy.       Objective:   Physical Exam  Constitutional: He appears well-developed and well-nourished.  Cardiovascular: Normal rate and regular  rhythm.   Pulmonary/Chest: Effort normal and breath sounds normal. No respiratory distress. He has no wheezes. He has no rales.  Genitourinary:  Genitourinary Comments: No testicle masses. No hernia. No reproducible tenderness on exam       Assessment:     Left scrotal pain. Not clear this is testicular . Testicle exam is unremarkable and he had ultrasound of the scrotum January 2015 which was mostly unremarkable. ?cremaster muscle strain vs other.  Doubt epididymitis.      Plan:     -Observe for now -Consider warm sitz baths -Consider over-the-counter Aleve or Advil for symptom relief -Wear good supportive briefs -Touch base in 2-3 weeks if not improving  Eulas Post MD [facility] at [facility]

## 2015-11-16 NOTE — Patient Instructions (Signed)
-  wear good supportive briefs -consider OTC Aleve or Advil with food for the next few days -consider warm sitz baths. -touch base in 2-3 weeks if pain no better and sooner for any swelling or other changes.

## 2015-11-16 NOTE — Progress Notes (Signed)
Pre visit review using our clinic review tool, if applicable. No additional management support is needed unless otherwise documented below in the visit note. 

## 2015-11-18 ENCOUNTER — Ambulatory Visit: Payer: BC Managed Care – PPO | Admitting: Family Medicine

## 2015-12-14 ENCOUNTER — Telehealth: Payer: Self-pay | Admitting: Family Medicine

## 2015-12-14 DIAGNOSIS — N5082 Scrotal pain: Secondary | ICD-10-CM

## 2015-12-14 NOTE — Telephone Encounter (Signed)
° °  Pt call to say that he is still having pain and was told by the doctor that if he was still having the pain he would refer him to a urologist

## 2015-12-14 NOTE — Telephone Encounter (Signed)
Go ahead and set up referral to [facility].  Chronic intermittent left scrotal pain.

## 2015-12-14 NOTE — Telephone Encounter (Signed)
Please advise on the next step.

## 2015-12-15 NOTE — Telephone Encounter (Signed)
Pt is aware that order has been entered. 

## 2016-02-02 ENCOUNTER — Ambulatory Visit (INDEPENDENT_AMBULATORY_CARE_PROVIDER_SITE_OTHER): Payer: BC Managed Care – PPO | Admitting: Family Medicine

## 2016-02-02 ENCOUNTER — Encounter: Payer: Self-pay | Admitting: Family Medicine

## 2016-02-02 VITALS — BP 128/72 | HR 83 | Temp 98.3°F | Ht 75.0 in | Wt 237.0 lb

## 2016-02-02 DIAGNOSIS — J029 Acute pharyngitis, unspecified: Secondary | ICD-10-CM | POA: Diagnosis not present

## 2016-02-02 LAB — POCT RAPID STREP A (OFFICE): Rapid Strep A Screen: NEGATIVE

## 2016-02-02 NOTE — Progress Notes (Signed)
Subjective:     Patient ID: Chris Perez, male   DOB: Jul 01, 1975, 41 y.o.   MRN: [id number]  HPI Acute visit. Patient noted some mild glandular swelling left neck and onset of sore throat about 4 days ago. He denies any fever, nausea, vomiting, diarrhea. Had some mild chills and very mild body aches. Minimal nasal congestion. No cough. No skin rash. No sick contacts. Throat feels somewhat better today. He describes remote history of peritonsillar abscess  Past Medical History:  Diagnosis Date  . Herpes zoster    T 9 level over abdomen  . Thyroid disease   . Von Willebrands disease ([place])    variant; no complications to date   Past Surgical History:  Procedure Laterality Date  . APPENDECTOMY     Early  1990's   . tonsillar abscess I& D  2003  . WISDOM TOOTH EXTRACTION      reports that he quit smoking about 7 years ago. He has never used smokeless tobacco. He reports that he drinks about 1.8 oz of alcohol per week . He reports that he does not use drugs. family history includes Arthritis in his mother; Cancer in his mother and sister; Thyroid disease in his mother and sister. Allergies  Allergen Reactions  . Aspirin     Due to medical condition  (von Willebrand's variant)     Review of Systems  Constitutional: Positive for chills. Negative for fever.  HENT: Positive for congestion and sore throat.   Respiratory: Negative for cough.   Hematological: Positive for adenopathy.       Objective:   Physical Exam  Constitutional: He appears well-developed and well-nourished.  HENT:  Right Ear: External ear normal.  Left Ear: External ear normal.  Mild posterior pharynx erythema. No exudate. No evidence for peritonsillar abscess.  Neck: Neck supple.  Minimal anterior cervical adenopathy bilaterally  Cardiovascular: Normal rate and regular rhythm.   Pulmonary/Chest: Effort normal. He has no wheezes. He has no rales.       Assessment:     Acute pharyngitis. Suspect viral.  Rule out strep    Plan:     -Check rapid strep screen (strep screen negative) -If negative, treat symptomatically  Eulas Post MD [facility] at [facility]

## 2016-02-02 NOTE — Patient Instructions (Signed)
Sore Throat A sore throat is pain, burning, irritation, or scratchiness in the throat. When you have a sore throat, you may feel pain or tenderness in your throat when you swallow or talk. Many things can cause a sore throat, including:  An infection.  Seasonal allergies.  Dryness in the air.  Irritants, such as smoke or pollution.  Gastroesophageal reflux disease (GERD).  A tumor. A sore throat is often the first sign of another sickness. It may happen with other symptoms, such as coughing, sneezing, fever, and swollen neck glands. Most sore throats go away without medical treatment. Follow these instructions at home:  Take over-the-counter medicines only as told by your health care provider.  Drink enough fluids to keep your urine clear or pale yellow.  Rest as needed.  To help with pain, try:  Sipping warm liquids, such as broth, herbal tea, or warm water.  Eating or drinking cold or frozen liquids, such as frozen ice pops.  Gargling with a salt-water mixture 3-4 times a day or as needed. To make a salt-water mixture, completely dissolve -1 tsp of salt in 1 cup of warm water.  Sucking on hard candy or throat lozenges.  Putting a cool-mist humidifier in your bedroom at night to moisten the air.  Sitting in the bathroom with the door closed for 5-10 minutes while you run hot water in the shower.  Do not use any tobacco products, such as cigarettes, chewing tobacco, and e-cigarettes. If you need help quitting, ask your health care provider. Contact a health care provider if:  You have a fever for more than 2-3 days.  You have symptoms that last (are persistent) for more than 2-3 days.  Your throat does not get better within 7 days.  You have a fever and your symptoms suddenly get worse. Get help right away if:  You have difficulty breathing.  You cannot swallow fluids, soft foods, or your saliva.  You have increased swelling in your throat or neck.  You have  persistent nausea and vomiting. This information is not intended to replace advice given to you by your health care provider. Make sure you discuss any questions you have with your health care provider. Document Released: 02/11/2004 Document Revised: 08/30/2015 Document Reviewed: 10/24/2014 Elsevier Interactive Patient Education  2017 [employer]  

## 2016-05-04 ENCOUNTER — Ambulatory Visit (INDEPENDENT_AMBULATORY_CARE_PROVIDER_SITE_OTHER): Payer: BC Managed Care – PPO | Admitting: Family Medicine

## 2016-05-04 VITALS — BP 110/70 | HR 100 | Temp 98.0°F

## 2016-05-04 DIAGNOSIS — R1084 Generalized abdominal pain: Secondary | ICD-10-CM

## 2016-05-04 LAB — CBC WITH DIFFERENTIAL/PLATELET
BASOS ABS: 0 10*3/uL (ref 0.0–0.1)
Basophils Relative: 0.5 % (ref 0.0–3.0)
EOS ABS: 0 10*3/uL (ref 0.0–0.7)
Eosinophils Relative: 0.5 % (ref 0.0–5.0)
HCT: 44.2 % (ref 39.0–52.0)
Hemoglobin: 15.3 g/dL (ref 13.0–17.0)
LYMPHS ABS: 0.5 10*3/uL — AB (ref 0.7–4.0)
Lymphocytes Relative: 14.9 % (ref 12.0–46.0)
MCHC: 34.5 g/dL (ref 30.0–36.0)
MCV: 89.8 fl (ref 78.0–100.0)
Monocytes Absolute: 0.6 10*3/uL (ref 0.1–1.0)
Monocytes Relative: 17.1 % — ABNORMAL HIGH (ref 3.0–12.0)
NEUTROS ABS: 2.5 10*3/uL (ref 1.4–7.7)
NEUTROS PCT: 67 % (ref 43.0–77.0)
PLATELETS: 217 10*3/uL (ref 150.0–400.0)
RBC: 4.92 Mil/uL (ref 4.22–5.81)
RDW: 13.1 % (ref 11.5–15.5)
WBC: 3.7 10*3/uL — ABNORMAL LOW (ref 4.0–10.5)

## 2016-05-04 LAB — COMPREHENSIVE METABOLIC PANEL
ALBUMIN: 4.4 g/dL (ref 3.5–5.2)
ALT: 19 U/L (ref 0–53)
AST: 23 U/L (ref 0–37)
Alkaline Phosphatase: 45 U/L (ref 39–117)
BUN: 16 mg/dL (ref 6–23)
CO2: 28 mEq/L (ref 19–32)
CREATININE: 1.01 mg/dL (ref 0.40–1.50)
Calcium: 8.9 mg/dL (ref 8.4–10.5)
Chloride: 102 mEq/L (ref 96–112)
GFR: 87.1 mL/min (ref >60.00)
GLUCOSE: 97 mg/dL (ref 70–99)
POTASSIUM: 3.8 meq/L (ref 3.5–5.1)
SODIUM: 139 meq/L (ref 135–145)
TOTAL PROTEIN: 7.2 g/dL (ref 6.0–8.3)
Total Bilirubin: 0.6 mg/dL (ref 0.2–1.2)

## 2016-05-04 LAB — LIPASE: Lipase: 26 U/L (ref 11.0–59.0)

## 2016-05-04 MED ORDER — ONDANSETRON 8 MG PO TBDP: 8.0000 mg | ORAL_TABLET | Freq: Three times a day (TID) | ORAL | 0 refills | Status: DC | PRN

## 2016-05-04 NOTE — Patient Instructions (Signed)

## 2016-05-04 NOTE — Progress Notes (Signed)
Pre visit review using our clinic review tool, if applicable. No additional management support is needed unless otherwise documented below in the visit note. 

## 2016-05-04 NOTE — Progress Notes (Signed)
Subjective:     Patient ID: Chris Perez, male   DOB: 09-03-1975, 41 y.o.   MRN: [id number]  HPI Patient seen for acute visit with onset yesterday of fever up to102. He's had some nausea but no vomiting. He describes some very poorly localized mid abdominal pain which does not radiate. He has some diffuse body aches. He denies any cough, nasal congestion, sore throat, dysuria, diarrhea. Took a couple Advil otherwise no recent nonsteroidals. No melena. Normal bowel movement yesterday. He's had previous appendectomy. Denies any shortness of breath. No pleuritic pain.  Abdominal pain very poorly localized and described as somewhat constant and sharp in nature. No reproducing factors. No alleviating factors. He has not had any definite fever today.  Past Medical History:  Diagnosis Date  . Herpes zoster    T 9 level over abdomen  . Thyroid disease   . Von Willebrands disease ([place])    variant; no complications to date   Past Surgical History:  Procedure Laterality Date  . APPENDECTOMY     Early  1990's   . tonsillar abscess I& D  2003  . WISDOM TOOTH EXTRACTION      reports that he quit smoking about 8 years ago. He has never used smokeless tobacco. He reports that he drinks about 1.8 oz of alcohol per week . He reports that he does not use drugs. family history includes Arthritis in his mother; Cancer in his mother and sister; Thyroid disease in his mother and sister. Allergies  Allergen Reactions  . Aspirin     Due to medical condition  (von Willebrand's variant)     Review of Systems  Constitutional: Positive for appetite change and fatigue. Negative for fever.  HENT: Negative for congestion and sore throat.   Respiratory: Negative for cough and shortness of breath.   Cardiovascular: Negative for chest pain.  Gastrointestinal: Positive for abdominal pain and nausea. Negative for blood in stool, constipation, diarrhea and vomiting.  Genitourinary: Negative for dysuria.   Neurological: Positive for light-headedness. Negative for syncope.       Objective:   Physical Exam  Constitutional: He appears well-developed and well-nourished.  HENT:  Mouth/Throat: Oropharynx is clear and moist.  Neck: Neck supple.  Cardiovascular: Normal rate and regular rhythm.   Pulmonary/Chest: Effort normal and breath sounds normal. No respiratory distress. He has no wheezes. He has no rales.  Abdominal: Soft. Bowel sounds are normal. He exhibits no distension and no mass. There is no tenderness. There is no rebound and no guarding.  No reproducible tenderness at this time. No guarding or rebound. Abdomen soft. Somewhat hypoactive bowel sounds  Lymphadenopathy:    He has no cervical adenopathy.       Assessment:     Patient presents with somewhat poorly localized abdominal pain of one-day duration and reported fever yesterday though none today. He does not report any respiratory type symptoms. Etiology unclear.  Non-acute abdomen.    Plan:     -Check labs with CBC with differential, comprehensive metabolic panel, lipase -Clear liquids as tolerated -Zofran 8 mg every 8 hours as needed for nausea or vomiting -Follow-up immediately for any progressive abdominal pain, recurrent vomiting, or other concerns -Continue to monitor temperature closely -consider CT abd and pelvis if symptoms persist or worsen.  Eulas Post MD [facility] at [facility]

## 2016-10-06 ENCOUNTER — Encounter: Payer: Self-pay | Admitting: Family Medicine

## 2017-04-17 ENCOUNTER — Ambulatory Visit: Payer: Self-pay

## 2017-04-17 ENCOUNTER — Encounter: Payer: Self-pay | Admitting: Family Medicine

## 2017-04-17 ENCOUNTER — Ambulatory Visit: Payer: BC Managed Care – PPO | Admitting: Family Medicine

## 2017-04-17 VITALS — BP 102/70 | HR 90 | Temp 98.3°F | Wt 234.0 lb

## 2017-04-17 DIAGNOSIS — R002 Palpitations: Secondary | ICD-10-CM

## 2017-04-17 DIAGNOSIS — R079 Chest pain, unspecified: Secondary | ICD-10-CM

## 2017-04-17 NOTE — Telephone Encounter (Signed)
Monitor for arrival here in office today.

## 2017-04-17 NOTE — Progress Notes (Signed)
Subjective:     Patient ID: Chris Perez, male   DOB: 10-28-1975, 42 y.o.   MRN: [id number]  HPI Patient seen to evaluate somewhat atypical chest symptoms past couple weeks. He describes some discomfort which is frequently fleeting lasting seconds and sometimes slightly longer which at times is sharp in quality and sometimes perceived as a tightness. No neck pain. Occasional mild paresthesia left arm. No left arm pain. Denies any nausea, vomiting, diaphoresis, or dyspnea.  Patient is nonsmoker. No recent cough or fever. No pleuritic pain. Symptoms have occurred predominantly at rest. He does hot yoga once or twice per week and also works out at the gym sometimes gets heart rate up 160 has not had any pain with exercise. No recent GERD symptoms. No family history of premature CAD. No history of diabetes. No significant dyslipidemia. No history of hypertension.  Other symptom is occasional sensation of skipped beat worse at night. No associated dizziness. No syncope. Does not correlate with chest pain symptoms. Minimal caffeine use. No regular alcohol use.  Past Medical History:  Diagnosis Date  . Herpes zoster    T 9 level over abdomen  . Thyroid disease   . Von Willebrands disease ([place])    variant; no complications to date   Past Surgical History:  Procedure Laterality Date  . APPENDECTOMY     Early  1990's   . tonsillar abscess I& D  2003  . WISDOM TOOTH EXTRACTION      reports that he quit smoking about 9 years ago. He has never used smokeless tobacco. He reports that he drinks about 1.8 oz of alcohol per week. He reports that he does not use drugs. family history includes Arthritis in his mother; Cancer in his mother and sister; Thyroid disease in his mother and sister; Von Willebrand disease in his unknown relative. Allergies  Allergen Reactions  . Aspirin     Due to medical condition  (von Willebrand's variant)     Review of Systems  Constitutional: Negative for appetite  change, chills, fever and unexpected weight change.  HENT: Negative for trouble swallowing.   Respiratory: Positive for chest tightness. Negative for cough, shortness of breath and wheezing.   Cardiovascular: Positive for chest pain. Negative for palpitations and leg swelling.  Gastrointestinal: Negative for abdominal pain.  Neurological: Negative for dizziness and syncope.       Objective:   Physical Exam  Constitutional: He appears well-developed and well-nourished.  HENT:  Mouth/Throat: Oropharynx is clear and moist.  Neck: Neck supple. No thyromegaly present.  Cardiovascular: Normal rate and regular rhythm.  No murmur heard. Pulmonary/Chest: Effort normal and breath sounds normal. No respiratory distress. He has no wheezes. He has no rales.  Musculoskeletal: He exhibits no edema.  Lymphadenopathy:    He has no cervical adenopathy.       Assessment:     Patient presents with 2 week history of somewhat atypical chest pain. His pretest probability would be fairly low for coronary disease. Atypical qualities include sharp quality of pain, symptoms at rest, brevity of symptoms during episode.  He also describes sensation of occasional skipped beat. Question PAC or PVCs    Plan:     -minimize caffeine use -We discussed further evaluation. Will set up plain exercise tolerance test. -Follow-up immediately for any exertional chest pain, dyspnea, or any other new symptoms  Eulas Post MD [facility] at [facility]

## 2017-04-17 NOTE — Telephone Encounter (Signed)
Confirmed patient has arrived now here in office to see Dr. Elease Hashimoto.

## 2017-04-17 NOTE — Patient Instructions (Signed)
Nonspecific Chest Pain Chest pain can be caused by many different conditions. There is always a chance that your pain could be related to something serious, such as a heart attack or a blood clot in your lungs. Chest pain can also be caused by conditions that are not life-threatening. If you have chest pain, it is very important to follow up with your health care provider. What are the causes? Causes of this condition include:  Heartburn.  Pneumonia or bronchitis.  Anxiety or stress.  Inflammation around your heart (pericarditis) or lung (pleuritis or pleurisy).  A blood clot in your lung.  A collapsed lung (pneumothorax). This can develop suddenly on its own (spontaneous pneumothorax) or from trauma to the chest.  Shingles infection (varicella-zoster virus).  Heart attack.  Damage to the bones, muscles, and cartilage that make up your chest wall. This can include: ? Bruised bones due to injury. ? Strained muscles or cartilage due to frequent or repeated coughing or overwork. ? Fracture to one or more ribs. ? Sore cartilage due to inflammation (costochondritis).  What increases the risk? Risk factors for this condition may include:  Activities that increase your risk for trauma or injury to your chest.  Respiratory infections or conditions that cause frequent coughing.  Medical conditions or overeating that can cause heartburn.  Heart disease or family history of heart disease.  Conditions or health behaviors that increase your risk of developing a blood clot.  Having had chicken pox (varicella zoster).  What are the signs or symptoms? Chest pain can feel like:  Burning or tingling on the surface of your chest or deep in your chest.  Crushing, pressure, aching, or squeezing pain.  Dull or sharp pain that is worse when you move, cough, or take a deep breath.  Pain that is also felt in your back, neck, shoulder, or arm, or pain that spreads to any of these  areas.  Your chest pain may come and go, or it may stay constant. How is this diagnosed? Lab tests or other studies may be needed to find the cause of your pain. Your health care provider may have you take a test called an ECG (electrocardiogram). An ECG records your heartbeat patterns at the time the test is performed. You may also have other tests, such as:  Transthoracic echocardiogram (TTE). In this test, sound waves are used to create a picture of the heart structures and to look at how blood flows through your heart.  Transesophageal echocardiogram (TEE).This is a more advanced imaging test that takes images from inside your body. It allows your health care provider to see your heart in finer detail.  Cardiac monitoring. This allows your health care provider to monitor your heart rate and rhythm in real time.  Holter monitor. This is a portable device that records your heartbeat and can help to diagnose abnormal heartbeats. It allows your health care provider to track your heart activity for several days, if needed.  Stress tests. These can be done through exercise or by taking medicine that makes your heart beat more quickly.  Blood tests.  Other imaging tests.  How is this treated? Treatment depends on what is causing your chest pain. Treatment may include:  Medicines. These may include: ? Acid blockers for heartburn. ? Anti-inflammatory medicine. ? Pain medicine for inflammatory conditions. ? Antibiotic medicine, if an infection is present. ? Medicines to dissolve blood clots. ? Medicines to treat coronary artery disease (CAD).  Supportive care for conditions that  do not require medicines. This may include: ? Resting. ? Applying heat or cold packs to injured areas. ? Limiting activities until pain decreases.  Follow these instructions at home: Medicines  If you were prescribed an antibiotic, take it as told by your health care provider. Do not stop taking the  antibiotic even if you start to feel better.  Take over-the-counter and prescription medicines only as told by your health care provider. Lifestyle  Do not use any products that contain nicotine or tobacco, such as cigarettes and e-cigarettes. If you need help quitting, ask your health care provider.  Do not drink alcohol.  Make lifestyle changes as directed by your health care provider. These may include: ? Getting regular exercise. Ask your health care provider to suggest some activities that are safe for you. ? Eating a heart-healthy diet. A registered dietitian can help you to learn healthy eating options. ? Maintaining a healthy weight. ? Managing diabetes, if necessary. ? Reducing stress, such as with yoga or relaxation techniques. General instructions  Avoid any activities that bring on chest pain.  If heartburn is the cause for your chest pain, raise (elevate) the head of your bed about 6 inches (15 cm) by putting blocks under the legs. Sleeping with more pillows does not effectively relieve heartburn because it only changes the position of your head.  Keep all follow-up visits as told by your health care provider. This is important. This includes any further testing if your chest pain does not go away. Contact a health care provider if:  Your chest pain does not go away.  You have a rash with blisters on your chest.  You have a fever.  You have chills. Get help right away if:  Your chest pain is worse.  You have a cough that gets worse, or you cough up blood.  You have severe pain in your abdomen.  You have severe weakness.  You faint.  You have sudden, unexplained chest discomfort.  You have sudden, unexplained discomfort in your arms, back, neck, or jaw.  You have shortness of breath at any time.  You suddenly start to sweat, or your skin gets clammy.  You feel nauseous or you vomit.  You suddenly feel light-headed or dizzy.  Your heart begins to beat  quickly, or it feels like it is skipping beats. These symptoms may represent a serious problem that is an emergency. Do not wait to see if the symptoms will go away. Get medical help right away. Call your local emergency services (911 in the [place] Do not drive yourself to the hospital. This information is not intended to replace advice given to you by your health care provider. Make sure you discuss any questions you have with your health care provider. Document Released: 10/13/2004 Document Revised: 09/28/2015 Document Reviewed: 09/28/2015 Elsevier Interactive Patient Education  2017 [employer].  We will set up stress test to further evaluate Follow up immediately for any exertional chest pain or shortness of breath.

## 2017-04-17 NOTE — Telephone Encounter (Signed)
Pt. Reports he has noticed some "shooting pains or sharp chest pains x 6 months ." Has become more noticeable the past 2 weeks. States it last for a few seconds to a few minutes. Denies any nausea, radiation, sweating , shortness of breath. Feels like he "has pauses in his pulse." Denies palpitations. Instructed to go to ED for any pain, radiation of pain, nausea, sweating. Verbalizes understanding. Appointment made for today.  Reason for Disposition . [1] Chest pain lasting <= 5 minutes AND [2] NO chest pain or cardiac symptoms now(Exceptions: pains lasting a few seconds)  Answer Assessment - Initial Assessment Questions 1. LOCATION: "Where does it hurt?"       No pain today - has a shooting pain sometimes 2. RADIATION: "Does the pain go anywhere else?" (e.g., into neck, jaw, arms, back)     No 3. ONSET: "When did the chest pain begin?" (Minutes, hours or days)      Maybe 6 months ago  - more frequent now 4. PATTERN "Does the pain come and go, or has it been constant since it started?"  "Does it get worse with exertion?"      Comes and goes 5. DURATION: "How long does it last" (e.g., seconds, minutes, hours)     A few seconds and sometimes a few minutes 6. SEVERITY: "How bad is the pain?"  (e.g., Scale 1-10; mild, moderate, or severe)    - MILD (1-3): doesn't interfere with normal activities     - MODERATE (4-7): interferes with normal activities or awakens from sleep    - SEVERE (8-10): excruciating pain, unable to do any normal activities       3-4 7. CARDIAC RISK FACTORS: "Do you have any history of heart problems or risk factors for heart disease?" (e.g., prior heart attack, angina; high blood pressure, diabetes, being overweight, high cholesterol, smoking, or strong family history of heart disease)     No 8. PULMONARY RISK FACTORS: "Do you have any history of lung disease?"  (e.g., blood clots in lung, asthma, emphysema, birth control pills)     No 9. CAUSE: "What do you think is  causing the chest pain?"     Unsure 10. OTHER SYMPTOMS: "Do you have any other symptoms?" (e.g., dizziness, nausea, vomiting, sweating, fever, difficulty breathing, cough)       Pauses in his pulse 11. PREGNANCY: "Is there any chance you are pregnant?" "When was your last menstrual period?"       No  Protocols used: CHEST PAIN-A-AH

## 2017-04-20 ENCOUNTER — Telehealth: Payer: Self-pay | Admitting: Family Medicine

## 2017-04-20 NOTE — Telephone Encounter (Signed)
Copied from [place] [id number]. Topic: Referral - Status >> Apr 20, 2017  9:10 AM Ahmed Prima L wrote: Reason for CRM: Patient states that he was in the office on 4/1. He said that Dr Elease Hashimoto was going to order a stress test for him with the Cardiologist. He said he has not heard from anyone Call back is [phone]

## 2017-04-21 NOTE — Telephone Encounter (Signed)
Stress test ordered and scheduled.

## 2017-04-25 ENCOUNTER — Ambulatory Visit: Payer: BC Managed Care – PPO

## 2017-04-25 DIAGNOSIS — R079 Chest pain, unspecified: Secondary | ICD-10-CM

## 2017-04-25 LAB — EXERCISE TOLERANCE TEST
CHL CUP MPHR: 180 {beats}/min
CHL CUP RESTING HR STRESS: 77 {beats}/min
CHL RATE OF PERCEIVED EXERTION: 17
Estimated workload: 12.5 METS
Exercise duration (min): 10 min
Exercise duration (sec): 30 s
Peak HR: 176 {beats}/min
Percent HR: 97 %

## 2017-05-30 ENCOUNTER — Telehealth: Payer: Self-pay | Admitting: Family Medicine

## 2017-05-30 NOTE — Telephone Encounter (Signed)
Copied from [place] [id number]. Topic: Inquiry >> May 30, 2017  2:14 PM Oliver Pila B wrote: Reason for CRM: pt called to have lab orders created to be tested for celiac (?? Right/wrong spelling) call pt to advise

## 2017-05-30 NOTE — Telephone Encounter (Signed)
rec follow up to discuss.  There are several tests that could be ordered.

## 2017-05-31 NOTE — Telephone Encounter (Signed)
Patient is aware and an appointment scheduled 

## 2017-06-07 ENCOUNTER — Ambulatory Visit: Payer: BC Managed Care – PPO | Admitting: Family Medicine

## 2017-06-07 ENCOUNTER — Encounter: Payer: Self-pay | Admitting: Family Medicine

## 2017-06-07 VITALS — BP 110/70 | HR 86 | Temp 98.2°F | Wt 235.2 lb

## 2017-06-07 DIAGNOSIS — Z8379 Family history of other diseases of the digestive system: Secondary | ICD-10-CM

## 2017-06-07 NOTE — Progress Notes (Signed)
   Subjective:    Patient ID: Chris Perez, male    DOB: 03/16/1975, 42 y.o.   MRN: [id number]  HPI Patient seen with request for celiac disease screening. He states his 30 year old daughter was just diagnosed with celiac disease which was confirmed with biopsies from endoscopy. Neither he nor his wife are aware of any family history of celiac disease. Patient does have history of autoimmune thyroiditis.   He is not aware of any problems with gluten sensitivity previously. He denies any recent changes in stools, arthralgias, skin rashes, abdominal cramp, or other concerning symptoms  Past Medical History:  Diagnosis Date  . Herpes zoster    T 9 level over abdomen  . Thyroid disease   . Von Willebrands disease ([place])    variant; no complications to date   Past Surgical History:  Procedure Laterality Date  . APPENDECTOMY     Early  1990's   . tonsillar abscess I& D  2003  . WISDOM TOOTH EXTRACTION      reports that he quit smoking about 9 years ago. He has never used smokeless tobacco. He reports that he drinks about 1.8 oz of alcohol per week. He reports that he does not use drugs. family history includes Arthritis in his mother; Cancer in his mother and sister; Thyroid disease in his mother and sister; Von Willebrand disease in his unknown relative. Allergies  Allergen Reactions  . Aspirin     Due to medical condition  (von Willebrand's variant)        Review of Systems  Constitutional: Negative for appetite change, chills, fever and unexpected weight change.  Gastrointestinal: Negative for abdominal pain, diarrhea, nausea and vomiting.       Objective:   Physical Exam  Constitutional: He appears well-developed and well-nourished.  Cardiovascular: Normal rate and regular rhythm.  Pulmonary/Chest: Effort normal and breath sounds normal.          Assessment & Plan:   Positive diagnosis of celiac disease in his 39 year old daughter. Patient currently asymptomatic.  He's requesting testing/screening.  Not on gluten free diet currently.  -Start with tissue transglutaminase IgA and total IgA -Patient had question regarding genetic testing and he would have to check with insurance coverage first (HLA- DQ2/DQ8)  Eulas Post MD [facility] at [facility]

## 2017-06-07 NOTE — Patient Instructions (Signed)
HLA-DQ2/DQ8 (genetic testing )- check with insurance.

## 2017-06-08 ENCOUNTER — Encounter: Payer: Self-pay | Admitting: Family Medicine

## 2017-06-08 LAB — TISSUE TRANSGLUTAMINASE, IGA: (tTG) Ab, IgA: 1 U/mL

## 2017-06-08 LAB — IGA: IGA: 289 mg/dL (ref 68–378)

## 2017-08-23 ENCOUNTER — Ambulatory Visit: Payer: BC Managed Care – PPO | Admitting: Family Medicine

## 2017-08-23 ENCOUNTER — Encounter: Payer: Self-pay | Admitting: Family Medicine

## 2017-08-23 VITALS — BP 102/68 | HR 79 | Temp 98.3°F | Wt 237.7 lb

## 2017-08-23 DIAGNOSIS — H5789 Other specified disorders of eye and adnexa: Secondary | ICD-10-CM

## 2017-08-23 DIAGNOSIS — H01113 Allergic dermatitis of right eye, unspecified eyelid: Secondary | ICD-10-CM

## 2017-08-23 MED ORDER — METHYLPREDNISOLONE ACETATE 80 MG/ML IJ SUSP
80.0000 mg | Freq: Once | INTRAMUSCULAR | Status: AC
  Administered 2017-08-23: 80 mg via INTRAMUSCULAR

## 2017-08-23 NOTE — Progress Notes (Signed)
  Subjective:     Patient ID: Chris Perez, male   DOB: 1975/11/18, 42 y.o.   MRN: [id number]  HPI Patient seen with some right eyelid irritation first noted this past Sunday. He had been doing quite a bit of yard work. He had more itching than anything. No discharge. No visual changes. He did a telemedicine visit and was prescribed Keflex 500 mg 4 times a day. He also started some over-the-counter Naphcon drops. He had some initial mild drainage left eye few days ago and those symptoms have fully cleared. No fevers or chills. He was concerned because his wife is scheduled to be induced for birth of their third child  tomorrow  Past Medical History:  Diagnosis Date  . Herpes zoster    T 9 level over abdomen  . Thyroid disease   . Von Willebrands disease ([place])    variant; no complications to date   Past Surgical History:  Procedure Laterality Date  . APPENDECTOMY     Early  1990's   . tonsillar abscess I& D  2003  . WISDOM TOOTH EXTRACTION      reports that he quit smoking about 9 years ago. He has never used smokeless tobacco. He reports that he drinks about 3.0 standard drinks of alcohol per week. He reports that he does not use drugs. family history includes Arthritis in his mother; Cancer in his mother and sister; Thyroid disease in his mother and sister; Von Willebrand disease in his unknown relative. Allergies  Allergen Reactions  . Aspirin     Due to medical condition  (von Willebrand's variant)      Review of Systems  Constitutional: Negative for chills and fever.  Eyes: Positive for itching. Negative for pain, discharge and visual disturbance.       Objective:   Physical Exam  Constitutional: He appears well-developed and well-nourished.  Eyes:  Patient has some erythema involving right upper eyelid mostly medially but extending somewhat to inner canthus region as well. Conjunctiva appears normal. No evidence for a stye or hordeolum. Nontender.  Extraocular  movements normal. Pupil equal round reactive to light.       Assessment:     Patient resents with some right upper eyelid swelling and itching with mild erythema. No evidence to suggest likely cellulitis. Suspect more allergic. No stye, hordeolum or abscess.  No conjunctivitis .    Plan:     -Discussed steroid either IM versus oral and he prefers to do IM. -Follow-up immediately for any progressive redness or other concerns -consider cool compresses for itching/swelling.  Eulas Post MD [facility] at [facility]

## 2017-08-23 NOTE — Patient Instructions (Signed)
Follow up for any fever, progressive redness, swelling, or other concerns.

## 2017-09-01 ENCOUNTER — Ambulatory Visit: Payer: Self-pay

## 2017-09-01 NOTE — Telephone Encounter (Signed)
Outgoing call to patient .  Patient called in earlier stating that right upper eye swelling has decreased . Slightly pink.  White head has developed. Was treated on 08/23/17 by Dr. Elease Hashimoto.  Wanted to know if this is a healing process?  Denies fever, pain and drainage. Does state the he uses daily contacts. Slightly itches.   Instructed patient to note if swelling increasing, or drainage occurs.  If fever occurs to call back.  Keep site clean and dry. Provided care advice.  Voiced understanding.       Answer Assessment - Initial Assessment Questions 1. LOCATION: "Which eye has the sty?" "Upper or lower eyelid?"     right eye 2. SIZE: "How big is it?" (Note: standard pencil eraser is 6 mm)    Less than a size of a pencil eraser.   3. EYELID: "Is the eyelid swollen?" If so, ask: "How much?"     np 4. REDNESS: "Has the redness spread onto the eyelid?"     no 5. ONSET: "When did you notice the sty?"     Last week was treated for it last Monday 6. VISION: "Do you have blurred vision?"      no 7. PAIN: "Is it painful?" If so, ask: "How bad is the pain?"  (Scale 1-10; or mild, moderate, severe)     no 8. CONTACTS: "Do you wear contacts?"     Yes daily 9. OTHER SYMPTOMS: "Do you have any other symptoms?" (e.g., fever)     no 10. PREGNANCY: "Is there any chance you are pregnant?" "When was your last menstrual period?"       Na  Protocols used: STY-A-AH

## 2017-12-13 ENCOUNTER — Ambulatory Visit (INDEPENDENT_AMBULATORY_CARE_PROVIDER_SITE_OTHER): Payer: BC Managed Care – PPO | Admitting: Family Medicine

## 2017-12-13 ENCOUNTER — Encounter: Payer: Self-pay | Admitting: Family Medicine

## 2017-12-13 VITALS — BP 108/78 | HR 76 | Temp 97.9°F | Ht 76.5 in | Wt 237.8 lb

## 2017-12-13 DIAGNOSIS — Z0000 Encounter for general adult medical examination without abnormal findings: Secondary | ICD-10-CM

## 2017-12-13 DIAGNOSIS — Z23 Encounter for immunization: Secondary | ICD-10-CM

## 2017-12-13 LAB — HEPATIC FUNCTION PANEL
ALBUMIN: 4.6 g/dL (ref 3.5–5.2)
ALT: 16 U/L (ref 0–53)
AST: 18 U/L (ref 0–37)
Alkaline Phosphatase: 44 U/L (ref 39–117)
BILIRUBIN TOTAL: 0.7 mg/dL (ref 0.2–1.2)
Bilirubin, Direct: 0.1 mg/dL (ref 0.0–0.3)
TOTAL PROTEIN: 7.3 g/dL (ref 6.0–8.3)

## 2017-12-13 LAB — CBC WITH DIFFERENTIAL/PLATELET
BASOS PCT: 0.7 % (ref 0.0–3.0)
Basophils Absolute: 0 10*3/uL (ref 0.0–0.1)
EOS ABS: 0.1 10*3/uL (ref 0.0–0.7)
Eosinophils Relative: 1.7 % (ref 0.0–5.0)
HCT: 41.3 % (ref 39.0–52.0)
Hemoglobin: 14.1 g/dL (ref 13.0–17.0)
Lymphocytes Relative: 35.9 % (ref 12.0–46.0)
Lymphs Abs: 1.2 10*3/uL (ref 0.7–4.0)
MCHC: 34.1 g/dL (ref 30.0–36.0)
MCV: 91.8 fl (ref 78.0–100.0)
MONO ABS: 0.4 10*3/uL (ref 0.1–1.0)
Monocytes Relative: 10.6 % (ref 3.0–12.0)
NEUTROS ABS: 1.8 10*3/uL (ref 1.4–7.7)
Neutrophils Relative %: 51.1 % (ref 43.0–77.0)
PLATELETS: 208 10*3/uL (ref 150.0–400.0)
RBC: 4.49 Mil/uL (ref 4.22–5.81)
RDW: 12.9 % (ref 11.5–15.5)
WBC: 3.4 10*3/uL — AB (ref 4.0–10.5)

## 2017-12-13 LAB — BASIC METABOLIC PANEL
BUN: 13 mg/dL (ref 6–23)
CO2: 30 mEq/L (ref 19–32)
Calcium: 9.6 mg/dL (ref 8.4–10.5)
Chloride: 101 mEq/L (ref 96–112)
Creatinine, Ser: 0.93 mg/dL (ref 0.40–1.50)
GFR: 95.04 mL/min (ref >60.00)
Glucose, Bld: 88 mg/dL (ref 70–99)
POTASSIUM: 4.2 meq/L (ref 3.5–5.1)
Sodium: 138 mEq/L (ref 135–145)

## 2017-12-13 LAB — T4, FREE: Free T4: 0.74 ng/dL (ref 0.60–1.60)

## 2017-12-13 LAB — LIPID PANEL
CHOLESTEROL: 166 mg/dL (ref 0–200)
HDL: 42.4 mg/dL (ref >39.00)
LDL CALC: 92 mg/dL (ref 0–99)
NonHDL: 124
TRIGLYCERIDES: 161 mg/dL — AB (ref 0.0–149.0)
Total CHOL/HDL Ratio: 4
VLDL: 32.2 mg/dL (ref 0.0–40.0)

## 2017-12-13 LAB — TSH: TSH: 3.51 u[IU]/mL (ref 0.35–4.50)

## 2017-12-13 LAB — T3, FREE: T3 FREE: 3.5 pg/mL (ref 2.3–4.2)

## 2017-12-13 NOTE — Progress Notes (Signed)
Subjective:     Patient ID: Chris Perez, male   DOB: 09-Aug-1975, 42 y.o.   MRN: [id number]  HPI Patient seen for physical exam.  Generally very healthy.  History of previous multinodular nontoxic goiter and lymphocytic thyroiditis.  Past history of migraine headaches.  No recent headaches.  Has had some nonspecific fatigue recently.  He thinks this may be stress related.  They have 3 children now including 26-month-old son.  They have a daughter and 2 sons.  Patient has not had flu vaccine yet.  Tetanus up-to-date.  Past Medical History:  Diagnosis Date  . Herpes zoster    T 9 level over abdomen  . Thyroid disease   . Von Willebrands disease ([place])    variant; no complications to date   Past Surgical History:  Procedure Laterality Date  . APPENDECTOMY     Early  1990's   . tonsillar abscess I& D  2003  . WISDOM TOOTH EXTRACTION      reports that he quit smoking about 9 years ago. He has never used smokeless tobacco. He reports that he drinks about 3.0 standard drinks of alcohol per week. He reports that he does not use drugs. family history includes Arthritis in his mother; Cancer in his mother and sister; Thyroid disease in his mother and sister; Von Willebrand disease in his unknown relative. Allergies  Allergen Reactions  . Aspirin     Due to medical condition  (von Willebrand's variant)     Review of Systems  Constitutional: Negative for activity change, appetite change, fatigue and fever.  HENT: Negative for congestion, ear pain and trouble swallowing.   Eyes: Negative for pain and visual disturbance.  Respiratory: Negative for cough, shortness of breath and wheezing.   Cardiovascular: Negative for chest pain and palpitations.  Gastrointestinal: Negative for abdominal distention, abdominal pain, blood in stool, constipation, diarrhea, nausea, rectal pain and vomiting.  Endocrine: Negative for polydipsia and polyuria.  Genitourinary: Negative for dysuria, hematuria and  testicular pain.  Musculoskeletal: Negative for arthralgias and joint swelling.  Skin: Negative for rash.  Neurological: Negative for dizziness, syncope and headaches.  Hematological: Negative for adenopathy.  Psychiatric/Behavioral: Negative for confusion and dysphoric mood.       Objective:   Physical Exam  Constitutional: He is oriented to person, place, and time. He appears well-developed and well-nourished. No distress.  HENT:  Head: Normocephalic and atraumatic.  Right Ear: External ear normal.  Left Ear: External ear normal.  Mouth/Throat: Oropharynx is clear and moist.  Eyes: Pupils are equal, round, and reactive to light. Conjunctivae and EOM are normal.  Neck: Normal range of motion. Neck supple. No thyromegaly present.  Cardiovascular: Normal rate, regular rhythm and normal heart sounds.  No murmur heard. Pulmonary/Chest: No respiratory distress. He has no wheezes. He has no rales.  Abdominal: Soft. Bowel sounds are normal. He exhibits no distension and no mass. There is no tenderness. There is no rebound and no guarding.  Musculoskeletal: He exhibits no edema.  Lymphadenopathy:    He has no cervical adenopathy.  Neurological: He is alert and oriented to person, place, and time. He displays normal reflexes. No cranial nerve deficit.  Skin: No rash noted.  Has some benign-appearing skin tags axillary region bilaterally  Psychiatric: He has a normal mood and affect.       Assessment:     Physical exam.  Patient has past history of hypothyroidism but currently not on medication    Plan:     -  Highly advised flu vaccine with 63-month-old infant at home and he agrees -Follow-up labs including TSH, free T4, and free T3 -Tetanus (Tdap) up-to-date  Eulas Post MD [facility] at [facility]

## 2018-08-15 ENCOUNTER — Telehealth: Payer: Self-pay | Admitting: Family Medicine

## 2018-08-15 NOTE — Telephone Encounter (Signed)
Pt had chills/body aches/dizzines/no fever from last Thursday to last Sunday.  Covid test came back negative.  Patient is requesting a call back to see if there is anything he needs to do.

## 2018-08-15 NOTE — Telephone Encounter (Signed)
If improving I don't think so.  Be in touch for any fever or recurrent symptoms.

## 2018-08-15 NOTE — Telephone Encounter (Signed)
Please see message. °

## 2018-08-16 ENCOUNTER — Ambulatory Visit: Payer: Self-pay | Admitting: Family Medicine

## 2018-08-16 NOTE — Telephone Encounter (Signed)
   Reason for Disposition . Health Information question, no triage required and triager able to answer question    He didn't realized Dr. Elease Perez had answered his question via his Mychart.  He submitted his symptoms there yesterday to Dr. Elease Perez.   I read Dr. Erick Perez answer to him.  Answer Assessment - Initial Assessment Questions 1. REASON FOR CALL or QUESTION: "What is your reason for calling today?" or "How can I best help you?" or "What question do you have that I can help answer?"     I returned his call.   He was c/o chills/body aches, fever and not feeling well for the last 4 days.   He had sent the same message to Dr. Elease Perez via his Mychart account.   Dr Chris Perez answered him.   I asked if he had seen his answer from Dr. Elease Perez on his Mychart and he said,  "No".    I read Dr. Erick Perez note straight from his Mychart message that he didn't need to do anything else for now.    Notify us if he gets worse or symptoms don't go away. Pt was agreeable to this.   He thanked me for my help.  Protocols used: INFORMATION ONLY CALL - NO TRIAGE-A-AH

## 2018-08-17 NOTE — Telephone Encounter (Signed)
Called patient and LMOVM to return call  [place] for [facility] to Discuss results / PCP / recommendations / Schedule patient  Left a detailed message for patient to let us know if he is improving or if he is still having symptoms.  CRM Created.

## 2018-11-09 ENCOUNTER — Encounter: Payer: Self-pay | Admitting: Family Medicine

## 2018-11-09 ENCOUNTER — Other Ambulatory Visit: Payer: Self-pay

## 2018-11-09 ENCOUNTER — Ambulatory Visit: Payer: BC Managed Care – PPO | Admitting: Family Medicine

## 2018-11-09 VITALS — BP 98/62 | HR 72 | Temp 98.3°F | Ht 76.5 in | Wt 238.0 lb

## 2018-11-09 DIAGNOSIS — B353 Tinea pedis: Secondary | ICD-10-CM | POA: Diagnosis not present

## 2018-11-09 DIAGNOSIS — R5383 Other fatigue: Secondary | ICD-10-CM

## 2018-11-09 LAB — T4, FREE: Free T4: 0.71 ng/dL (ref 0.60–1.60)

## 2018-11-09 LAB — CBC WITH DIFFERENTIAL/PLATELET
Basophils Absolute: 0 10*3/uL (ref 0.0–0.1)
Basophils Relative: 0.9 % (ref 0.0–3.0)
Eosinophils Absolute: 0.1 10*3/uL (ref 0.0–0.7)
Eosinophils Relative: 2.9 % (ref 0.0–5.0)
HCT: 41.6 % (ref 39.0–52.0)
Hemoglobin: 14.7 g/dL (ref 13.0–17.0)
Lymphocytes Relative: 37.4 % (ref 12.0–46.0)
Lymphs Abs: 1.2 10*3/uL (ref 0.7–4.0)
MCHC: 35.3 g/dL (ref 30.0–36.0)
MCV: 91.7 fl (ref 78.0–100.0)
Monocytes Absolute: 0.4 10*3/uL (ref 0.1–1.0)
Monocytes Relative: 13.3 % — ABNORMAL HIGH (ref 3.0–12.0)
Neutro Abs: 1.4 10*3/uL (ref 1.4–7.7)
Neutrophils Relative %: 45.5 % (ref 43.0–77.0)
Platelets: 227 10*3/uL (ref 150.0–400.0)
RBC: 4.54 Mil/uL (ref 4.22–5.81)
RDW: 13 % (ref 11.5–15.5)
WBC: 3.1 10*3/uL — ABNORMAL LOW (ref 4.0–10.5)

## 2018-11-09 LAB — TSH: TSH: 4.12 u[IU]/mL (ref 0.35–4.50)

## 2018-11-09 NOTE — Progress Notes (Signed)
Subjective:     Patient ID: Chris Perez, male   DOB: 07-01-1975, 42 y.o.   MRN: [id number]  HPI  Chris Perez relates episode of fatigue last Wednesday.  He states back in early August he had respiratory illness with body aches, sore throat, dizziness.  He had Covid testing that was negative.  Last Wednesday he got up and did his usual workout without difficulty and then around 11 AM had, sudden onset of fatigue.  No fever.  No chills.  No cough.  No chest pain.  Symptoms lasted about a day.  Generally getting good sleep.  Good appetite.  No weight changes.  No dysuria.  No diarrhea.  Denied any pain.  He states his energy is about back to baseline at this time.  He had concerns because he has had abnormal thyroid functions in the past.  He was requesting getting those rechecked.  Second issue is he has history of recurrent athlete's foot and currently has scaly rash between the toes fourth and fifth digits bilaterally.  He has not tried any recent over-the-counter medications other than tea tree oil  Past Medical History:  Diagnosis Date  . Herpes zoster    T 9 level over abdomen  . Thyroid disease   . Von Willebrands disease ([place])    variant; no complications to date   Past Surgical History:  Procedure Laterality Date  . APPENDECTOMY     Early  1990's   . tonsillar abscess I& D  2003  . WISDOM TOOTH EXTRACTION      reports that he quit smoking about 10 years ago. He has never used smokeless tobacco. He reports current alcohol use of about 3.0 standard drinks of alcohol per week. He reports that he does not use drugs. family history includes Arthritis in his mother; Cancer in his mother and sister; Thyroid disease in his mother and sister; Von Willebrand disease in his unknown relative. Allergies  Allergen Reactions  . Aspirin     Due to medical condition  (von Willebrand's variant)     Review of Systems  Constitutional: Positive for fatigue. Negative for appetite change, chills,  fever and unexpected weight change.  Respiratory: Negative for cough, shortness of breath and wheezing.   Cardiovascular: Negative for chest pain, palpitations and leg swelling.  Gastrointestinal: Negative for abdominal pain, diarrhea, nausea and vomiting.  Genitourinary: Negative for dysuria.  Skin: Negative for rash.  Neurological: Negative for dizziness and headaches.  Hematological: Negative for adenopathy.       Objective:   Physical Exam Constitutional:      General: He is not in acute distress.    Appearance: He is well-developed. He is not ill-appearing.  HENT:     Right Ear: External ear normal.     Left Ear: External ear normal.  Eyes:     Pupils: Pupils are equal, round, and reactive to light.  Neck:     Musculoskeletal: Neck supple.     Thyroid: No thyromegaly.  Cardiovascular:     Rate and Rhythm: Normal rate and regular rhythm.  Pulmonary:     Effort: Pulmonary effort is normal. No respiratory distress.     Breath sounds: Normal breath sounds. No wheezing or rales.  Abdominal:     Palpations: Abdomen is soft. There is no splenomegaly or mass.     Tenderness: There is no abdominal tenderness. There is no guarding.  Lymphadenopathy:     Cervical: No cervical adenopathy.  Skin:    Findings:  Rash present.     Comments: He has scaly rash between the fourth and fifth digits of both feet  Neurological:     Mental Status: He is alert and oriented to person, place, and time.        Assessment:     #1 transient fatigue last week.  No other real specific symptoms.  Past history of transient hypothyroidism  #2 tinea pedis involving both feet    Plan:     -Recommend trial of over-the-counter Lotrimin and be in touch if fungal rash not clearing -Check labs with TSH, free T4, CBC -Follow-up for any recurrent episodes of fatigue or other concerns or new symptoms  Eulas Post MD [facility] at [facility]

## 2018-12-17 ENCOUNTER — Other Ambulatory Visit: Payer: Self-pay

## 2018-12-17 ENCOUNTER — Encounter: Payer: Self-pay | Admitting: Family Medicine

## 2018-12-17 DIAGNOSIS — R5383 Other fatigue: Secondary | ICD-10-CM

## 2018-12-21 ENCOUNTER — Telehealth: Payer: Self-pay | Admitting: Family Medicine

## 2018-12-21 NOTE — Telephone Encounter (Signed)
Copied from [place] [id number]. Topic: General - Other >> Dec 21, 2018  9:49 AM Rainey Pines A wrote: Patient would like a callback from [facility] to go over the lab work that's been ordered and possible add another panel. Please advise

## 2018-12-24 ENCOUNTER — Telehealth: Payer: Self-pay

## 2018-12-24 NOTE — Telephone Encounter (Signed)
Copied from [place] [id number]. Topic: General - Other >> Dec 21, 2018  9:49 AM Rainey Pines A wrote: Patient would like a callback from [facility] to go over the lab work that's been ordered and possible add another panel. Please advise >> Dec 24, 2018 10:18 AM Keene Breath wrote: Patient is calling back to ask the nurse to call him regarding some labs he is scheduled to take

## 2018-12-24 NOTE — Telephone Encounter (Signed)
Called patient and cancelled the lab appointment for Wednesday and I made his appointment for 30 minutes because he has 2 things to discuss and I wanted to make sure he had enough time. Patient verbalized an understanding.

## 2018-12-24 NOTE — Telephone Encounter (Signed)
This has been sent in a different message.

## 2018-12-24 NOTE — Telephone Encounter (Signed)
My suggestion is that we get his labs AFTER his appointment.  That way we can discuss any needed labs.  If we don't and just started added several labs without the appropriate dx code he could get stuck with a lab bill for things not covered by insurance.

## 2018-12-24 NOTE — Telephone Encounter (Signed)
Copied from [place] [phone]. Topic: General - Other >> Dec 21, 2018  9:49 AM Rainey Pines A wrote: Patient would like a callback from [facility] to go over the lab work that's been ordered and possible add another panel. Please advise >> Dec 24, 2018 10:18 AM Keene Breath wrote: Patient is calling back to ask the nurse to call him regarding some labs he is scheduled to take

## 2018-12-24 NOTE — Telephone Encounter (Signed)
Called patient and he sent this message on 12/17/18 and I did not see it: Ok thank you, if possible can they look at my nutrition levels as well?   He states that he wants his nutritional levels checked when he comes for lab on 12/26/18. He also asked if he could have his vitamin D, vitamin B12, and his hemoglobin A1c checked also? He is still having fatigue and has a lab appointment on 12/26/18 at 7:40am and has an appointment with you on 12/26/18 at 8am for something different.  Please advise if OK to add these labs.

## 2018-12-24 NOTE — Telephone Encounter (Signed)
This has been answered in a different message.

## 2018-12-26 ENCOUNTER — Encounter: Payer: Self-pay | Admitting: Family Medicine

## 2018-12-26 ENCOUNTER — Ambulatory Visit: Payer: BC Managed Care – PPO | Admitting: Family Medicine

## 2018-12-26 ENCOUNTER — Other Ambulatory Visit: Payer: Self-pay

## 2018-12-26 ENCOUNTER — Other Ambulatory Visit: Payer: BC Managed Care – PPO

## 2018-12-26 VITALS — BP 134/82 | HR 77 | Temp 97.8°F | Ht 76.5 in | Wt 237.8 lb

## 2018-12-26 DIAGNOSIS — R5383 Other fatigue: Secondary | ICD-10-CM | POA: Diagnosis not present

## 2018-12-26 LAB — COMPREHENSIVE METABOLIC PANEL
ALT: 16 U/L (ref 0–53)
AST: 17 U/L (ref 0–37)
Albumin: 4.5 g/dL (ref 3.5–5.2)
Alkaline Phosphatase: 47 U/L (ref 39–117)
BUN: 18 mg/dL (ref 6–23)
CO2: 30 mEq/L (ref 19–32)
Calcium: 9.4 mg/dL (ref 8.4–10.5)
Chloride: 104 mEq/L (ref 96–112)
Creatinine, Ser: 1.08 mg/dL (ref 0.40–1.50)
GFR: 74.87 mL/min (ref >60.00)
Glucose, Bld: 95 mg/dL (ref 70–99)
Potassium: 4.4 mEq/L (ref 3.5–5.1)
Sodium: 139 mEq/L (ref 135–145)
Total Bilirubin: 0.5 mg/dL (ref 0.2–1.2)
Total Protein: 7.2 g/dL (ref 6.0–8.3)

## 2018-12-26 LAB — VITAMIN D 25 HYDROXY (VIT D DEFICIENCY, FRACTURES): VITD: 23.66 ng/mL — ABNORMAL LOW (ref 30.00–100.00)

## 2018-12-26 LAB — VITAMIN B12: Vitamin B-12: 245 pg/mL (ref 211–911)

## 2018-12-26 NOTE — Progress Notes (Signed)
Subjective:     Patient ID: Chris Perez, male   DOB: 1975/08/02, 43 y.o.   MRN: [id number]  HPI   Chris Perez is here to discuss fatigue issues.  He has had episodic fatigue which sometimes last for 1 or 2 days.  He had episode back in August and went for Covid testing which came back negative.  He then had episode right after Thanksgiving that lasted a couple days.  He woke up morning after getting a good night sleep and felt very fatigued to the point of difficulty getting out of bed.  He did not describe any significant myalgias or arthralgias.  He had some transient thyroid abnormality several years ago but recent testing was normal.  Recent CBC unremarkable.  He has occasional urine frequency but no burning.  He was concerned about possible diabetes.  No weight loss.  No dry mouth.  He also had called requesting that he would like to get some "vitamin level testing ".  He is not a vegetarian.  He had specifically requested vitamin D level.  He recently scaled back alcohol.  He is try to eat well.  Getting 7 to 8 hours sleep at night.  No clinical suspicion for obstructive sleep apnea.  He is exercising more and no exercise intolerance.  No chest pains.  No dyspnea.  No documented fevers.  Does not relate any post exercise myalgias.  No recent arthralgias.  He has had some mild urine frequency and was worried about possibility of diabetes.  He has not had any polydipsia or weight loss.  He is generally only getting up about 1 time at night to urinate.  No witnessed apnea episodes and really no significant daytime somnolence  He does have stress of raising young family as well as school and work but denies any specific stressors otherwise  Past Medical History:  Diagnosis Date  . Herpes zoster    T 9 level over abdomen  . Thyroid disease   . Von Willebrands disease ([place])    variant; no complications to date   Past Surgical History:  Procedure Laterality Date  . APPENDECTOMY     Early   1990's   . tonsillar abscess I& D  2003  . WISDOM TOOTH EXTRACTION      reports that he quit smoking about 10 years ago. He has never used smokeless tobacco. He reports current alcohol use of about 3.0 standard drinks of alcohol per week. He reports that he does not use drugs. family history includes Arthritis in his mother; Cancer in his mother and sister; Thyroid disease in his mother and sister; Von Willebrand disease in an other family member. Allergies  Allergen Reactions  . Aspirin     Due to medical condition  (von Willebrand's variant)   Wt Readings from Last 3 Encounters:  12/26/18 237 lb 12.8 oz (107.9 kg)  11/09/18 238 lb (108 kg)  12/13/17 237 lb 12.8 oz (107.9 kg)     Review of Systems  Constitutional: Positive for fatigue. Negative for appetite change, chills, fever and unexpected weight change.  HENT: Negative for sore throat and trouble swallowing.   Eyes: Negative for visual disturbance.  Respiratory: Negative for cough and shortness of breath.   Cardiovascular: Negative for chest pain.  Gastrointestinal: Negative for abdominal pain, diarrhea, nausea and vomiting.  Endocrine: Negative for polydipsia and polyuria.  Genitourinary: Negative for dysuria.  Musculoskeletal: Negative for arthralgias.  Skin: Negative for rash.  Neurological: Negative for tremors, weakness and  headaches.  Hematological: Negative for adenopathy. Does not bruise/bleed easily.       Objective:   Physical Exam Vitals signs reviewed.  Constitutional:      Appearance: He is well-developed. He is not ill-appearing or toxic-appearing.  HENT:     Head: Normocephalic and atraumatic.     Right Ear: Tympanic membrane normal.     Left Ear: Tympanic membrane normal.  Neck:     Musculoskeletal: Normal range of motion and neck supple.     Thyroid: No thyromegaly.  Cardiovascular:     Rate and Rhythm: Normal rate and regular rhythm.     Heart sounds: No murmur. No gallop.   Pulmonary:      Effort: Pulmonary effort is normal.     Breath sounds: Normal breath sounds.  Abdominal:     General: There is no distension.     Palpations: Abdomen is soft. There is no mass.     Tenderness: There is no abdominal tenderness. There is no guarding or rebound.  Lymphadenopathy:     Cervical: No cervical adenopathy.  Skin:    Findings: No rash.  Neurological:     General: No focal deficit present.     Mental Status: He is alert.  Psychiatric:        Mood and Affect: Mood normal.        Behavior: Behavior normal.     Comments: PHQ-9 equals 6        Assessment:     Chris Perez presents with several month history of episodic/transient fatigue.  He has not had any objective findings such as weight loss or documented fever and really very few specific symptoms.  PHQ-9 equals 6 and doubt significant clinical depression.  He does not describe any daytime somnolence or other suggestion of obstructive sleep apnea    Plan:     -Check fasting comprehensive metabolic panel, 123456, 25 hydroxy vitamin D - continue to get adequate sleep -Continue regular exercise habits -If he has any repeat night sweats document temperatures to make sure no fever -Continue relatively low glycemic diet and alcohol restriction  Eulas Post MD [facility] at [facility]

## 2018-12-26 NOTE — Patient Instructions (Signed)

## 2019-01-09 ENCOUNTER — Ambulatory Visit: Payer: BC Managed Care – PPO | Attending: Internal Medicine

## 2019-01-09 DIAGNOSIS — Z20822 Contact with and (suspected) exposure to covid-19: Secondary | ICD-10-CM

## 2019-01-11 LAB — NOVEL CORONAVIRUS, NAA: SARS-CoV-2, NAA: NOT DETECTED

## 2019-01-12 ENCOUNTER — Encounter: Payer: Self-pay | Admitting: Family Medicine

## 2019-01-12 LAB — CBC AND DIFFERENTIAL
HCT: 41 (ref 41–53)
Hemoglobin: 14.2 (ref 13.5–17.5)
Platelets: 119 — AB (ref 150–399)
WBC: 1.8

## 2019-01-12 LAB — CBC: RBC: 4.67 (ref 3.87–5.11)

## 2019-01-14 ENCOUNTER — Other Ambulatory Visit: Payer: Self-pay

## 2019-01-14 ENCOUNTER — Telehealth (INDEPENDENT_AMBULATORY_CARE_PROVIDER_SITE_OTHER): Payer: BC Managed Care – PPO | Admitting: Family Medicine

## 2019-01-14 ENCOUNTER — Encounter: Payer: Self-pay | Admitting: Family Medicine

## 2019-01-14 DIAGNOSIS — J189 Pneumonia, unspecified organism: Secondary | ICD-10-CM

## 2019-01-14 DIAGNOSIS — D696 Thrombocytopenia, unspecified: Secondary | ICD-10-CM

## 2019-01-14 DIAGNOSIS — R509 Fever, unspecified: Secondary | ICD-10-CM

## 2019-01-14 NOTE — Telephone Encounter (Signed)
Reviewed x-ray.  He has some mild prominent interstitial streaks in the right base but cannot see any definite infiltrate.  White count was 1.8 thousand with platelet count 119,000.  No left shift.  We have recommended that he return in 1 week for repeat CBC to assess stability of white count and platelets and he will be scheduled for a week from today

## 2019-01-14 NOTE — Progress Notes (Signed)
This visit type was conducted due to national recommendations for restrictions regarding the COVID-19 pandemic in an effort to limit this patient's exposure and mitigate transmission in our community.   Virtual Visit via Video Note  I connected with Chris Perez on 01/14/19 at 11:45 AM EST by a video enabled telemedicine application and verified that I am speaking with the correct person using two identifiers.  Location patient: home Location provider:work or home office Persons participating in the virtual visit: patient, provider  I discussed the limitations of evaluation and management by telemedicine and the availability of in person appointments. The patient expressed understanding and agreed to proceed.   HPI: He relates onset last Tuesday of increased fatigue and body aches.  He had past history of some recurrent fatigue and aches and thought this was probably similar.  By Wednesday, he had fever 102 by Thursday this was 103 and by Friday 104.  He had some initial mild diarrhea but this resolved after about a day.  No cough.  No dyspnea.  He went to urgent care on Saturday and had chest x-ray which initially was read as no pneumonia but then radiologist suspected pneumonia.  Patient was started on Levaquin 500 mg daily.  His temperature Sunday was back down to 100 and today 97.  Still has no cough and no dyspnea.  No recent dysuria.  No skin rashes.  Occasional headache.  No loss of taste or smell.  Patient had Covid testing x2 last week which was negative.  No known sick contacts.  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   ROS: See pertinent positives and negatives per HPI.  Past Medical History:  Diagnosis Date  . Herpes zoster    T 9 level over abdomen  . Thyroid disease   . Von Willebrands disease ([place])    variant; no complications to date    Past Surgical History:  Procedure Laterality Date  . APPENDECTOMY     Early  1990's   .  tonsillar abscess I& D  2003  . WISDOM TOOTH EXTRACTION      Family History  Problem Relation Age of Onset  . Arthritis Mother   . Thyroid disease Mother        on medication   . Cancer Mother        thyroid cancer  . Thyroid disease Sister        on medication   . Cancer Sister        thyroid cancer  . Von Willebrand disease Other        mother, niece & ? sister    SOCIAL HX: Non-smoker   Current Outpatient Medications:  Marland Kitchen  Multiple Vitamins-Minerals (EMERGEN-C IMMUNE PO), Take 1 packet by mouth daily as needed., Disp: , Rfl:   EXAM:  VITALS per patient if applicable:  GENERAL: alert, oriented, appears well and in no acute distress  HEENT: atraumatic, conjunttiva clear, no obvious abnormalities on inspection of external nose and ears  NECK: normal movements of the head and neck  LUNGS: on inspection no signs of respiratory distress, breathing rate appears normal, no obvious gross SOB, gasping or wheezing  CV: no obvious cyanosis  MS: moves all visible extremities without noticeable abnormality  PSYCH/NEURO: pleasant and cooperative, no obvious depression or anxiety, speech and thought processing grossly intact  ASSESSMENT AND PLAN:  Discussed the following assessment and plan:  Febrile illness with reported pneumonia by recent chest x-ray  -Patient  will try to get copy of labs and chest x-ray report of drop-off for our review -He seems to be improving and will finish out his antibiotic.  He was placed on very broad-spectrum Levaquin but since he is improving on this we decided not to change his regimen since he has no side effects -Stay well-hydrated and plenty of rest -Touch base for any recurrent fever or other concerns    I discussed the assessment and treatment plan with the patient. The patient was provided an opportunity to ask questions and all were answered. The patient agreed with the plan and demonstrated an understanding of the instructions.   The  patient was advised to call back or seek an in-person evaluation if the symptoms worsen or if the condition fails to improve as anticipated.    Carolann Littler, MD

## 2019-01-21 ENCOUNTER — Other Ambulatory Visit: Payer: Self-pay

## 2019-01-22 ENCOUNTER — Other Ambulatory Visit (INDEPENDENT_AMBULATORY_CARE_PROVIDER_SITE_OTHER): Payer: BC Managed Care – PPO

## 2019-01-22 DIAGNOSIS — D696 Thrombocytopenia, unspecified: Secondary | ICD-10-CM

## 2019-01-22 DIAGNOSIS — R5383 Other fatigue: Secondary | ICD-10-CM

## 2019-01-22 LAB — CBC WITH DIFFERENTIAL/PLATELET
Basophils Absolute: 0 10*3/uL (ref 0.0–0.1)
Basophils Relative: 0.8 % (ref 0.0–3.0)
Eosinophils Absolute: 0.1 10*3/uL (ref 0.0–0.7)
Eosinophils Relative: 1.8 % (ref 0.0–5.0)
HCT: 39.6 % (ref 39.0–52.0)
Hemoglobin: 13.6 g/dL (ref 13.0–17.0)
Lymphocytes Relative: 40.6 % (ref 12.0–46.0)
Lymphs Abs: 1.7 10*3/uL (ref 0.7–4.0)
MCHC: 34.3 g/dL (ref 30.0–36.0)
MCV: 89.9 fl (ref 78.0–100.0)
Monocytes Absolute: 0.5 10*3/uL (ref 0.1–1.0)
Monocytes Relative: 12.8 % — ABNORMAL HIGH (ref 3.0–12.0)
Neutro Abs: 1.8 10*3/uL (ref 1.4–7.7)
Neutrophils Relative %: 44 % (ref 43.0–77.0)
Platelets: 319 10*3/uL (ref 150.0–400.0)
RBC: 4.41 Mil/uL (ref 4.22–5.81)
RDW: 12.8 % (ref 11.5–15.5)
WBC: 4.2 10*3/uL (ref 4.0–10.5)

## 2019-01-22 LAB — COMPREHENSIVE METABOLIC PANEL
ALT: 66 U/L — ABNORMAL HIGH (ref 0–53)
AST: 27 U/L (ref 0–37)
Albumin: 4.3 g/dL (ref 3.5–5.2)
Alkaline Phosphatase: 55 U/L (ref 39–117)
BUN: 18 mg/dL (ref 6–23)
CO2: 30 mEq/L (ref 19–32)
Calcium: 9.3 mg/dL (ref 8.4–10.5)
Chloride: 101 mEq/L (ref 96–112)
Creatinine, Ser: 0.94 mg/dL (ref 0.40–1.50)
GFR: 87.85 mL/min (ref >60.00)
Glucose, Bld: 97 mg/dL (ref 70–99)
Potassium: 4.5 mEq/L (ref 3.5–5.1)
Sodium: 138 mEq/L (ref 135–145)
Total Bilirubin: 0.5 mg/dL (ref 0.2–1.2)
Total Protein: 6.8 g/dL (ref 6.0–8.3)

## 2019-01-23 ENCOUNTER — Other Ambulatory Visit: Payer: Self-pay

## 2019-01-23 DIAGNOSIS — R7401 Elevation of levels of liver transaminase levels: Secondary | ICD-10-CM

## 2019-02-15 ENCOUNTER — Other Ambulatory Visit (INDEPENDENT_AMBULATORY_CARE_PROVIDER_SITE_OTHER): Payer: BC Managed Care – PPO

## 2019-02-15 ENCOUNTER — Other Ambulatory Visit: Payer: Self-pay

## 2019-02-15 DIAGNOSIS — R5383 Other fatigue: Secondary | ICD-10-CM

## 2019-02-15 DIAGNOSIS — R7401 Elevation of levels of liver transaminase levels: Secondary | ICD-10-CM

## 2019-02-15 DIAGNOSIS — Z1152 Encounter for screening for COVID-19: Secondary | ICD-10-CM

## 2019-02-15 DIAGNOSIS — D72819 Decreased white blood cell count, unspecified: Secondary | ICD-10-CM | POA: Diagnosis not present

## 2019-02-15 LAB — CBC WITH DIFFERENTIAL/PLATELET
Basophils Absolute: 0 10*3/uL (ref 0.0–0.1)
Basophils Relative: 0.9 % (ref 0.0–3.0)
Eosinophils Absolute: 0.1 10*3/uL (ref 0.0–0.7)
Eosinophils Relative: 2 % (ref 0.0–5.0)
HCT: 41.2 % (ref 39.0–52.0)
Hemoglobin: 14.2 g/dL (ref 13.0–17.0)
Lymphocytes Relative: 41.7 % (ref 12.0–46.0)
Lymphs Abs: 1.3 10*3/uL (ref 0.7–4.0)
MCHC: 34.5 g/dL (ref 30.0–36.0)
MCV: 89.6 fl (ref 78.0–100.0)
Monocytes Absolute: 0.3 10*3/uL (ref 0.1–1.0)
Monocytes Relative: 10.3 % (ref 3.0–12.0)
Neutro Abs: 1.4 10*3/uL (ref 1.4–7.7)
Neutrophils Relative %: 45.1 % (ref 43.0–77.0)
Platelets: 218 10*3/uL (ref 150.0–400.0)
RBC: 4.6 Mil/uL (ref 4.22–5.81)
RDW: 13.3 % (ref 11.5–15.5)
WBC: 3 10*3/uL — ABNORMAL LOW (ref 4.0–10.5)

## 2019-02-15 LAB — HEPATIC FUNCTION PANEL
ALT: 21 U/L (ref 0–53)
AST: 23 U/L (ref 0–37)
Albumin: 4.5 g/dL (ref 3.5–5.2)
Alkaline Phosphatase: 50 U/L (ref 39–117)
Bilirubin, Direct: 0.1 mg/dL (ref 0.0–0.3)
Total Bilirubin: 0.6 mg/dL (ref 0.2–1.2)
Total Protein: 7.2 g/dL (ref 6.0–8.3)

## 2019-02-15 NOTE — Addendum Note (Signed)
Addended by: Suzette Battiest on: 02/15/2019 07:59 AM   Modules accepted: Orders

## 2019-02-21 ENCOUNTER — Encounter: Payer: Self-pay | Admitting: Family Medicine

## 2019-11-13 ENCOUNTER — Ambulatory Visit (INDEPENDENT_AMBULATORY_CARE_PROVIDER_SITE_OTHER): Payer: Self-pay | Admitting: Family Medicine

## 2019-11-13 ENCOUNTER — Other Ambulatory Visit: Payer: Self-pay

## 2019-11-13 ENCOUNTER — Encounter: Payer: Self-pay | Admitting: Family Medicine

## 2019-11-13 VITALS — BP 108/68 | HR 91 | Temp 98.9°F | Ht 76.5 in | Wt 222.8 lb

## 2019-11-13 DIAGNOSIS — D72819 Decreased white blood cell count, unspecified: Secondary | ICD-10-CM

## 2019-11-13 DIAGNOSIS — S61011A Laceration without foreign body of right thumb without damage to nail, initial encounter: Secondary | ICD-10-CM

## 2019-11-13 DIAGNOSIS — W540XXA Bitten by dog, initial encounter: Secondary | ICD-10-CM

## 2019-11-13 DIAGNOSIS — S60371A Other superficial bite of right thumb, initial encounter: Secondary | ICD-10-CM

## 2019-11-13 LAB — CBC WITH DIFFERENTIAL/PLATELET
Absolute Monocytes: 543 cells/uL (ref 200–950)
Basophils Absolute: 18 cells/uL (ref 0–200)
Basophils Relative: 0.4 %
Eosinophils Absolute: 51 cells/uL (ref 15–500)
Eosinophils Relative: 1.1 %
HCT: 42.9 % (ref 38.5–50.0)
Hemoglobin: 15 g/dL (ref 13.2–17.1)
Lymphs Abs: 603 cells/uL — ABNORMAL LOW (ref 850–3900)
MCH: 31.3 pg (ref 27.0–33.0)
MCHC: 35 g/dL (ref 32.0–36.0)
MCV: 89.4 fL (ref 80.0–100.0)
MPV: 10.3 fL (ref 7.5–12.5)
Monocytes Relative: 11.8 %
Neutro Abs: 3386 cells/uL (ref 1500–7800)
Neutrophils Relative %: 73.6 %
Platelets: 205 10*3/uL (ref 140–400)
RBC: 4.8 10*6/uL (ref 4.20–5.80)
RDW: 12.1 % (ref 11.0–15.0)
Total Lymphocyte: 13.1 %
WBC: 4.6 10*3/uL (ref 3.8–10.8)

## 2019-11-13 MED ORDER — AMOXICILLIN-POT CLAVULANATE 875-125 MG PO TABS: 1.0000 | ORAL_TABLET | Freq: Two times a day (BID) | ORAL | 0 refills | Status: DC

## 2019-11-13 NOTE — Progress Notes (Signed)
   Subjective:    Patient ID: Chris Perez, male    DOB: March 15, 1975, 43 y.o.   MRN: [id number]  HPI Here for a dog bite that occurred on 11-11-19. While walking his dog (who is UTD on shots) he encountered another person walking their dog. Chris Perez's dog got off his leash and became aggressive toward the other dog, so he reached out to separate them. His dog then bit him on the right thumb. He cleaned it with peroxide and he has been using medicated bandages since then. The pain has resolved and the thumb feels good. Also he has a hx of low WBC counts, and he asks if this can be checked while he is here today. His PCP is Dr. Elease Hashimoto. His last WBC on 02-15-19 was 3.0, and the other cell counts were normal.    Review of Systems  Constitutional: Negative.   Respiratory: Negative.   Cardiovascular: Negative.   Skin: Positive for wound.       Objective:   Physical Exam Constitutional:      Appearance: Normal appearance.  Cardiovascular:     Rate and Rhythm: Normal rate and regular rhythm.     Pulses: Normal pulses.     Heart sounds: Normal heart sounds.  Pulmonary:     Effort: Pulmonary effort is normal.     Breath sounds: Normal breath sounds.  Skin:    Comments: Right thumb has a 1 cm laceration on the side. This looks clean. No drainage or erythema   Neurological:     Mental Status: He is alert.           Assessment & Plan:  For the dog bite, we will cover with 10 days of Augmentin. For the leukopenia, we will check a CBC today. Alysia Penna, MD

## 2019-11-22 ENCOUNTER — Encounter: Payer: Self-pay | Admitting: Family Medicine

## 2019-12-02 ENCOUNTER — Encounter: Payer: Self-pay | Admitting: Family Medicine

## 2019-12-02 ENCOUNTER — Other Ambulatory Visit: Payer: Self-pay

## 2019-12-03 ENCOUNTER — Ambulatory Visit (INDEPENDENT_AMBULATORY_CARE_PROVIDER_SITE_OTHER): Payer: Commercial Managed Care - PPO | Admitting: Family Medicine

## 2019-12-03 ENCOUNTER — Encounter: Payer: Self-pay | Admitting: Family Medicine

## 2019-12-03 VITALS — BP 122/77 | HR 72 | Ht 76.5 in | Wt 232.0 lb

## 2019-12-03 DIAGNOSIS — R208 Other disturbances of skin sensation: Secondary | ICD-10-CM

## 2019-12-03 NOTE — Progress Notes (Signed)
Established Patient Office Visit  Subjective:  Patient ID: Chris Perez, male    DOB: 01/05/1976  Age: 44 y.o. MRN: [id number]  CC:  Chief Complaint  Patient presents with  . Oral Swelling    HPI [name] presents for intermittent symptoms of "tingling" and occasional burning sensation just right and lateral to the cricoid cartilage region.  He has not seen any swelling.  No lymphadenopathy.  No dysphagia.  No hoarseness.  No appetite or weight changes.  He still smokes lightly occasionally.  No oral tobacco use though otherwise.  He has had somewhat similar symptoms in the past.  He has not noted any thyroid nodules or thyroid swelling.  He did have remote history of lymphocytic thyroiditis years ago.  Denies any active reflux symptoms.  No recent skin rashes.  No neck pain.  Past Medical History:  Diagnosis Date  . Herpes zoster    T 9 level over abdomen  . Thyroid disease   . Von Willebrands disease ([place])    variant; no complications to date    Past Surgical History:  Procedure Laterality Date  . APPENDECTOMY     Early  1990's   . tonsillar abscess I& D  2003  . WISDOM TOOTH EXTRACTION      Family History  Problem Relation Age of Onset  . Arthritis Mother   . Thyroid disease Mother        on medication   . Cancer Mother        thyroid cancer  . Thyroid disease Sister        on medication   . Cancer Sister        thyroid cancer  . Von Willebrand disease Other        mother, niece & ? sister    Social History   Socioeconomic History  . Marital status: Married    Spouse name: Not on file  . Number of children: Not on file  . Years of education: Not on file  . Highest education level: Not on file  Occupational History  . Not on file  Tobacco Use  . Smoking status: Former Smoker    Quit date: 03/17/2008    Years since quitting: 11.7  . Smokeless tobacco: Never Used  Vaping Use  . Vaping Use: Former  . Devices: Only tried  Substance and Sexual  Activity  . Alcohol use: Yes    Alcohol/week: 3.0 standard drinks    Types: 3 Cans of beer per week  . Drug use: No  . Sexual activity: Yes    Partners: Female  Other Topics Concern  . Not on file  Social History Narrative   Regular exercise: biking 2 x a week   Caffeine Use: seldom         Social Determinants of Health   Financial Resource Strain:   . Difficulty of Paying Living Expenses: Not on file  Food Insecurity:   . Worried About Charity fundraiser in the Last Year: Not on file  . Ran Out of Food in the Last Year: Not on file  Transportation Needs:   . Lack of Transportation (Medical): Not on file  . Lack of Transportation (Non-Medical): Not on file  Physical Activity:   . Days of Exercise per Week: Not on file  . Minutes of Exercise per Session: Not on file  Stress:   . Feeling of Stress : Not on file  Social Connections:   . Frequency  of Communication with Friends and Family: Not on file  . Frequency of Social Gatherings with Friends and Family: Not on file  . Attends Religious Services: Not on file  . Active Member of Clubs or Organizations: Not on file  . Attends Archivist Meetings: Not on file  . Marital Status: Not on file  Intimate Partner Violence:   . Fear of Current or Ex-Partner: Not on file  . Emotionally Abused: Not on file  . Physically Abused: Not on file  . Sexually Abused: Not on file    Outpatient Medications Prior to Visit  Medication Sig Dispense Refill  . Multiple Vitamins-Minerals (EMERGEN-C IMMUNE PO) Take 1 packet by mouth daily as needed.    Marland Kitchen amoxicillin-clavulanate (AUGMENTIN) 875-125 MG tablet Take 1 tablet by mouth 2 (two) times daily. 20 tablet 0   No facility-administered medications prior to visit.    Allergies  Allergen Reactions  . Aspirin     Due to medical condition  (von Willebrand's variant)    ROS Review of Systems  Constitutional: Negative for appetite change, chills, fever and unexpected weight  change.  HENT: Negative for sore throat, trouble swallowing and voice change.   Respiratory: Negative for shortness of breath.   Hematological: Negative for adenopathy.      Objective:    Physical Exam Vitals reviewed.  Constitutional:      Appearance: Normal appearance.  HENT:     Mouth/Throat:     Comments: Oropharynx reveals no lesions. Neck:     Comments: No thyroid nodules palpated.  No neck adenopathy. Cardiovascular:     Rate and Rhythm: Normal rate and regular rhythm.  Pulmonary:     Effort: Pulmonary effort is normal.     Breath sounds: Normal breath sounds.  Musculoskeletal:     Cervical back: Neck supple.  Lymphadenopathy:     Cervical: No cervical adenopathy.  Neurological:     Mental Status: He is alert.     BP 122/77   Pulse 72   Ht 6' 4.5" (1.943 m)   Wt 232 lb (105.2 kg)   BMI 27.87 kg/m  Wt Readings from Last 3 Encounters:  12/03/19 232 lb (105.2 kg)  11/13/19 222 lb 12.8 oz (101.1 kg)  12/26/18 237 lb 12.8 oz (107.9 kg)     Health Maintenance Due  Topic Date Due  . Hepatitis C Screening  Never done  . HIV Screening  Never done  . INFLUENZA VACCINE  08/18/2019    There are no preventive care reminders to display for this patient.  Lab Results  Component Value Date   TSH 4.12 11/09/2018   Lab Results  Component Value Date   WBC 4.6 11/13/2019   HGB 15.0 11/13/2019   HCT 42.9 11/13/2019   MCV 89.4 11/13/2019   PLT 205 11/13/2019   Lab Results  Component Value Date   NA 138 01/22/2019   K 4.5 01/22/2019   CO2 30 01/22/2019   GLUCOSE 97 01/22/2019   BUN 18 01/22/2019   CREATININE 0.94 01/22/2019   BILITOT 0.6 02/15/2019   ALKPHOS 50 02/15/2019   AST 23 02/15/2019   ALT 21 02/15/2019   PROT 7.2 02/15/2019   ALBUMIN 4.5 02/15/2019   CALCIUM 9.3 01/22/2019   GFR 87.85 01/22/2019   Lab Results  Component Value Date   CHOL 166 12/13/2017   Lab Results  Component Value Date   HDL 42.40 12/13/2017   Lab Results    Component Value Date  [place] 92 12/13/2017   Lab Results  Component Value Date   TRIG 161.0 (H) 12/13/2017   Lab Results  Component Value Date   CHOLHDL 4 12/13/2017   No results found for: HGBA1C    Assessment & Plan:   Vague dysesthesias right side of neck which are intermittent.  He does not have any red flags such as appetite change, weight loss, lymphadenopathy, hoarseness, dysphagia, or any palpable masses.  -Recommend observation for now. -Strongly encouraged to stop smoking. -If symptoms persist consider ENT referral for further evaluation  No orders of the defined types were placed in this encounter.   Follow-up: No follow-ups on file.    Carolann Littler, MD

## 2019-12-03 NOTE — Patient Instructions (Signed)
Watch for signs and symptoms such as appetite change, weight loss, lymphadenopathy, pain with swallowing

## 2019-12-06 ENCOUNTER — Ambulatory Visit: Payer: Self-pay | Admitting: Family Medicine

## 2020-05-20 ENCOUNTER — Telehealth (INDEPENDENT_AMBULATORY_CARE_PROVIDER_SITE_OTHER): Payer: Commercial Managed Care - PPO | Admitting: Family Medicine

## 2020-05-20 ENCOUNTER — Other Ambulatory Visit: Payer: Self-pay

## 2020-05-20 VITALS — Temp 99.0°F

## 2020-05-20 DIAGNOSIS — R509 Fever, unspecified: Secondary | ICD-10-CM | POA: Diagnosis not present

## 2020-05-20 DIAGNOSIS — R059 Cough, unspecified: Secondary | ICD-10-CM | POA: Diagnosis not present

## 2020-05-20 MED ORDER — AZITHROMYCIN 250 MG PO TABS: ORAL_TABLET | ORAL | 0 refills | Status: AC

## 2020-05-20 NOTE — Progress Notes (Signed)
Patient ID: Chris Perez, male   DOB: 02-20-1975, 45 y.o.   MRN: [id number]  This visit type was conducted due to national recommendations for restrictions regarding the COVID-19 pandemic in an effort to limit this patient's exposure and mitigate transmission in our community.   Virtual Visit via Telephone Note  I connected with Chris Perez on 05/20/20 at 10:00 AM EDT by telephone and verified that I am speaking with the correct person using two identifiers.   I discussed the limitations, risks, security and privacy concerns of performing an evaluation and management service by telephone and the availability of in person appointments. I also discussed with the patient that there may be a patient responsible charge related to this service. The patient expressed understanding and agreed to proceed.  Location patient: home Location provider: work or home office Participants present for the call: patient, provider Patient did not have a visit in the prior 7 days to address this/these issue(s).   History of Present Illness:  Chris Perez called with onset of illness last Saturday.  He developed some body aches and temperature 100.5.  Felt very much the same on Sunday.  Actually felt better by Monday and went to work Tuesday but developed later in the day some increased nasal congestion and some cough.  Now has fever this morning 101.  No dyspnea.  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  They apparently had a couple kids out recently with influenza.  He denies any nausea, vomiting, or diarrhea. He states he had community-acquired pneumonia back in 2020 and felt somewhat similar then.  Past Medical History:  Diagnosis Date  . Herpes zoster    T 9 level over abdomen  . Thyroid disease   . Von Willebrands disease ([place])    variant; no complications to date   Past Surgical History:  Procedure Laterality Date  .  APPENDECTOMY     Early  1990's   . tonsillar abscess I& D  2003  . WISDOM TOOTH EXTRACTION      reports that he quit smoking about 12 years ago. He has never used smokeless tobacco. He reports current alcohol use of about 3.0 standard drinks of alcohol per week. He reports that he does not use drugs. family history includes Arthritis in his mother; Cancer in his mother and sister; Thyroid disease in his mother and sister; Von Willebrand disease in an other family member. Allergies  Allergen Reactions  . Aspirin     Due to medical condition  (von Willebrand's variant)      Observations/Objective: Patient sounds cheerful and well on the phone. I do not appreciate any SOB. Speech and thought processing are grossly intact. Patient reported vitals:  Assessment and Plan:  Febrile illness with cough.  Home COVID test negative.  We discussed the possibilities could still be COVID or influenza (onset over 48 hours ago) versus other illness.  He did have interval of at least the day with no fever now has second wave of fever which does raise possibility of bacterial illness such as early pneumonia.  He does not any red flag symptoms such as recurrent vomiting, dyspnea, tachypnea  -We elected to cover with Zithromax for 5 days and stay well-hydrated and plenty of rest. -Follow-up immediately for any dyspnea or other concerns  Follow Up Instructions:  - as above   99441 5-10 99442 11-20 99443 21-30 I  did not refer this patient for an OV in the next 24 hours for this/these issue(s).  I discussed the assessment and treatment plan with the patient. The patient was provided an opportunity to ask questions and all were answered. The patient agreed with the plan and demonstrated an understanding of the instructions.   The patient was advised to call back or seek an in-person evaluation if the symptoms worsen or if the condition fails to improve as anticipated.  I provided 15 minutes of  non-face-to-face time during this encounter.   Carolann Littler, MD

## 2021-06-29 ENCOUNTER — Encounter: Payer: Self-pay | Admitting: Family Medicine

## 2021-06-29 ENCOUNTER — Ambulatory Visit (INDEPENDENT_AMBULATORY_CARE_PROVIDER_SITE_OTHER): Payer: Commercial Managed Care - PPO | Admitting: Family Medicine

## 2021-06-29 VITALS — BP 124/70 | HR 70 | Temp 97.7°F | Ht 75.2 in | Wt 234.6 lb

## 2021-06-29 DIAGNOSIS — Z23 Encounter for immunization: Secondary | ICD-10-CM

## 2021-06-29 DIAGNOSIS — Z0000 Encounter for general adult medical examination without abnormal findings: Secondary | ICD-10-CM

## 2021-06-29 LAB — BASIC METABOLIC PANEL
BUN: 16 mg/dL (ref 6–23)
CO2: 30 mEq/L (ref 19–32)
Calcium: 9.6 mg/dL (ref 8.4–10.5)
Chloride: 102 mEq/L (ref 96–112)
Creatinine, Ser: 0.95 mg/dL (ref 0.40–1.50)
GFR: 97.13 mL/min (ref >60.00)
Glucose, Bld: 91 mg/dL (ref 70–99)
Potassium: 4.3 mEq/L (ref 3.5–5.1)
Sodium: 137 mEq/L (ref 135–145)

## 2021-06-29 LAB — LIPID PANEL
Cholesterol: 158 mg/dL (ref 0–200)
HDL: 50.1 mg/dL (ref >39.00)
LDL Cholesterol: 80 mg/dL (ref 0–99)
NonHDL: 108.19
Total CHOL/HDL Ratio: 3
Triglycerides: 139 mg/dL (ref 0.0–149.0)
VLDL: 27.8 mg/dL (ref 0.0–40.0)

## 2021-06-29 LAB — CBC WITH DIFFERENTIAL/PLATELET
Basophils Absolute: 0 10*3/uL (ref 0.0–0.1)
Basophils Relative: 0.7 % (ref 0.0–3.0)
Eosinophils Absolute: 0.1 10*3/uL (ref 0.0–0.7)
Eosinophils Relative: 2.5 % (ref 0.0–5.0)
HCT: 41.5 % (ref 39.0–52.0)
Hemoglobin: 14 g/dL (ref 13.0–17.0)
Lymphocytes Relative: 31 % (ref 12.0–46.0)
Lymphs Abs: 1.2 10*3/uL (ref 0.7–4.0)
MCHC: 33.8 g/dL (ref 30.0–36.0)
MCV: 93.7 fl (ref 78.0–100.0)
Monocytes Absolute: 0.5 10*3/uL (ref 0.1–1.0)
Monocytes Relative: 11.7 % (ref 3.0–12.0)
Neutro Abs: 2.1 10*3/uL (ref 1.4–7.7)
Neutrophils Relative %: 54.1 % (ref 43.0–77.0)
Platelets: 222 10*3/uL (ref 150.0–400.0)
RBC: 4.42 Mil/uL (ref 4.22–5.81)
RDW: 13.4 % (ref 11.5–15.5)
WBC: 3.9 10*3/uL — ABNORMAL LOW (ref 4.0–10.5)

## 2021-06-29 LAB — HEPATIC FUNCTION PANEL
ALT: 19 U/L (ref 0–53)
AST: 20 U/L (ref 0–37)
Albumin: 4.4 g/dL (ref 3.5–5.2)
Alkaline Phosphatase: 54 U/L (ref 39–117)
Bilirubin, Direct: 0.2 mg/dL (ref 0.0–0.3)
Total Bilirubin: 0.7 mg/dL (ref 0.2–1.2)
Total Protein: 7.2 g/dL (ref 6.0–8.3)

## 2021-06-29 LAB — T4, FREE: Free T4: 0.74 ng/dL (ref 0.60–1.60)

## 2021-06-29 LAB — TSH: TSH: 3.53 u[IU]/mL (ref 0.35–5.50)

## 2021-06-29 NOTE — Progress Notes (Signed)
Established Patient Office Visit  Subjective   Patient ID: Chris Perez, male    DOB: December 22, 1975  Age: 46 y.o. MRN: [id number]  Chief Complaint  Patient presents with   Annual Exam    HPI   Lexx is here for physical exam.  He has history of migraine headaches, lymphocytic thyroiditis, remote history of alcohol abuse.  Doing well currently.  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  He takes no regular medications.  He brings in form from his school to complete for insurance purposes.  Health maintenance reviewed  -No history of prior hepatitis C screening. -We will turn 46 in August.  No prior colonoscopy. -Tetanus due at this time  Family history-both mother and sister had thyroid cancer.  He has a brother who was without significant health problems.  No family history of premature CAD or type 2 diabetes.  No other cancers reported.  Social history-married.  He has children ages 68, 26, 63.  He quit smoking 2010.  No alcohol use.  Works as a Pharmacist, hospital.  Off for the summer.  Past Medical History:  Diagnosis Date   Herpes zoster    T 9 level over abdomen   Thyroid disease    Von Willebrands disease ([place])    variant; no complications to date   Past Surgical History:  Procedure Laterality Date   APPENDECTOMY     Early  1990's    tonsillar abscess I& D  2003   WISDOM TOOTH EXTRACTION      reports that he quit smoking about 13 years ago. He has never used smokeless tobacco. He reports current alcohol use of about 3.0 standard drinks of alcohol per week. He reports that he does not use drugs. family history includes Arthritis in his mother; Cancer in his mother and sister; Thyroid disease in his mother and sister; Von Willebrand disease in an other family member. Allergies  Allergen Reactions   Aspirin     Due to medical condition  (von Willebrand's variant)      Review of Systems  Constitutional:  Negative for chills,  fever, malaise/fatigue and weight loss.  HENT:  Negative for hearing loss.   Eyes:  Negative for blurred vision and double vision.  Respiratory:  Negative for cough and shortness of breath.   Cardiovascular:  Negative for chest pain, palpitations and leg swelling.  Gastrointestinal:  Negative for abdominal pain, blood in stool, constipation and diarrhea.  Genitourinary:  Negative for dysuria.  Skin:  Negative for rash.  Neurological:  Negative for dizziness, speech change, seizures, loss of consciousness and headaches.  Psychiatric/Behavioral:  Negative for depression.       Objective:     BP 124/70 (BP Location: Left Arm, Patient Position: Sitting, Cuff Size: Normal)   Pulse 70   Temp 97.7 F (36.5 C) (Oral)   Ht 6' 3.2" (1.91 m)   Wt 234 lb 9.6 oz (106.4 kg)   SpO2 99%   BMI 29.17 kg/m  BP Readings from Last 3 Encounters:  06/29/21 124/70  12/03/19 122/77  11/13/19 108/68   Wt Readings from Last 3 Encounters:  06/29/21 234 lb 9.6 oz (106.4 kg)  12/03/19 232 lb (105.2 kg)  11/13/19 222 lb 12.8 oz (101.1 kg)      Physical Exam Constitutional:      General: He is not in acute distress.    Appearance: He is well-developed.  HENT:  Head: Normocephalic and atraumatic.     Right Ear: External ear normal.     Left Ear: External ear normal.  Eyes:     Conjunctiva/sclera: Conjunctivae normal.     Pupils: Pupils are equal, round, and reactive to light.  Neck:     Thyroid: No thyromegaly.  Cardiovascular:     Rate and Rhythm: Normal rate and regular rhythm.     Heart sounds: Normal heart sounds. No murmur heard. Pulmonary:     Effort: No respiratory distress.     Breath sounds: No wheezing or rales.  Abdominal:     General: Bowel sounds are normal. There is no distension.     Palpations: Abdomen is soft. There is no mass.     Tenderness: There is no abdominal tenderness. There is no guarding or rebound.  Musculoskeletal:     Cervical back: Normal range of motion  and neck supple.     Right lower leg: No edema.     Left lower leg: No edema.  Lymphadenopathy:     Cervical: No cervical adenopathy.  Skin:    Findings: No rash.  Neurological:     Mental Status: He is alert and oriented to person, place, and time.     Cranial Nerves: No cranial nerve deficit.      No results found for any visits on 06/29/21.  Last CBC Lab Results  Component Value Date   WBC 4.6 11/13/2019   HGB 15.0 11/13/2019   HCT 42.9 11/13/2019   MCV 89.4 11/13/2019   MCH 31.3 11/13/2019   RDW 12.1 11/13/2019   PLT 205 56/21/3086   Last metabolic panel Lab Results  Component Value Date   GLUCOSE 97 01/22/2019   NA 138 01/22/2019   K 4.5 01/22/2019   CL 101 01/22/2019   CO2 30 01/22/2019   BUN 18 01/22/2019   CREATININE 0.94 01/22/2019   CALCIUM 9.3 01/22/2019   PROT 7.2 02/15/2019   ALBUMIN 4.5 02/15/2019   BILITOT 0.6 02/15/2019   ALKPHOS 50 02/15/2019   AST 23 02/15/2019   ALT 21 02/15/2019   Last lipids Lab Results  Component Value Date   CHOL 166 12/13/2017   HDL 42.40 12/13/2017   LDLCALC 92 12/13/2017   LDLDIRECT 91.0 04/15/2014   TRIG 161.0 (H) 12/13/2017   CHOLHDL 4 12/13/2017   Last thyroid functions Lab Results  Component Value Date   TSH 4.12 11/09/2018   T3TOTAL 99.3 05/08/2012   T4TOTAL 4.9 (L) 05/08/2012      The ASCVD Risk score (Arnett DK, et al., 2019) failed to calculate for the following reasons:   Cannot find a previous HDL lab   Cannot find a previous total cholesterol lab    Assessment & Plan:   Problem List Items Addressed This Visit   None Visit Diagnoses     Physical exam    -  Primary   Relevant Orders   Basic metabolic panel   Lipid panel   CBC with Differential/Platelet   TSH   Hepatic function panel   Hep C Antibody   T4, Free     -Obtain screening labs as above -Tdap given -Check hepatitis C antibody -We discussed colon cancer screening at age 75.  He will check on coverage and let us know if  interested.  No follow-ups on file.    Carolann Littler, MD

## 2021-06-29 NOTE — Addendum Note (Signed)
Addended by: Nilda Riggs on: 06/29/2021 10:07 AM   Modules accepted: Orders

## 2021-06-30 LAB — HEPATITIS C ANTIBODY
Hepatitis C Ab: NONREACTIVE
SIGNAL TO CUT-OFF: 0.08 (ref <1.00)

## 2021-09-27 ENCOUNTER — Encounter: Payer: Self-pay | Admitting: Gastroenterology

## 2021-11-08 ENCOUNTER — Ambulatory Visit (AMBULATORY_SURGERY_CENTER): Payer: Self-pay

## 2021-11-08 VITALS — Ht 75.0 in | Wt 231.0 lb

## 2021-11-08 DIAGNOSIS — Z1211 Encounter for screening for malignant neoplasm of colon: Secondary | ICD-10-CM

## 2021-11-08 MED ORDER — NA SULFATE-K SULFATE-MG SULF 17.5-3.13-1.6 GM/177ML PO SOLN: 1.0000 | Freq: Once | ORAL | 0 refills | Status: AC

## 2021-11-08 NOTE — Progress Notes (Signed)

## 2021-11-30 ENCOUNTER — Encounter: Payer: Self-pay | Admitting: Gastroenterology

## 2021-12-07 ENCOUNTER — Encounter: Payer: Self-pay | Admitting: Gastroenterology

## 2021-12-07 ENCOUNTER — Ambulatory Visit (AMBULATORY_SURGERY_CENTER): Payer: Commercial Managed Care - PPO | Admitting: Gastroenterology

## 2021-12-07 VITALS — BP 120/72 | HR 62 | Resp 10

## 2021-12-07 DIAGNOSIS — D123 Benign neoplasm of transverse colon: Secondary | ICD-10-CM | POA: Diagnosis not present

## 2021-12-07 DIAGNOSIS — D125 Benign neoplasm of sigmoid colon: Secondary | ICD-10-CM | POA: Diagnosis not present

## 2021-12-07 DIAGNOSIS — Z1211 Encounter for screening for malignant neoplasm of colon: Secondary | ICD-10-CM | POA: Diagnosis not present

## 2021-12-07 MED ORDER — SODIUM CHLORIDE 0.9 % IV SOLN: 500.0000 mL | INTRAVENOUS | Status: DC

## 2021-12-07 NOTE — Op Note (Signed)
[facility] Patient Name: Chris Perez Procedure Date: 12/07/2021 9:11 AM MRN: [id number] Endoscopist: Ladene Artist , MD, [id number] Age: 46 Referring MD:  Date of Birth: [birth year] Gender: Male Account #: [id number] Procedure:                Colonoscopy Indications:              Screening for colorectal malignant neoplasm Medicines:                Monitored Anesthesia Care Procedure:                Pre-Anesthesia Assessment:                           - Prior to the procedure, a History and Physical                            was performed, and patient medications and                            allergies were reviewed. The patient's tolerance of                            previous anesthesia was also reviewed. The risks                            and benefits of the procedure and the sedation                            options and risks were discussed with the patient.                            All questions were answered, and informed consent                            was obtained. Prior Anticoagulants: The patient has                            taken no anticoagulant or antiplatelet agents. ASA                            Grade Assessment: II - A patient with mild systemic                            disease. After reviewing the risks and benefits,                            the patient was deemed in satisfactory condition to                            undergo the procedure.                           After obtaining informed consent, the colonoscope  was passed under direct vision. Throughout the                            procedure, the patient's blood pressure, pulse, and                            oxygen saturations were monitored continuously. The                            CF [id number] #[id number] was introduced through the anus                            and advanced to the the cecum, identified by                            appendiceal  orifice and ileocecal valve. The                            ileocecal valve, appendiceal orifice, and rectum                            were photographed. The quality of the bowel                            preparation was excellent. The colonoscopy was                            performed without difficulty. The patient tolerated                            the procedure well. Scope In: 9:19:09 AM Scope Out: 9:37:18 AM Scope Withdrawal Time: 0 hours 15 minutes 4 seconds  Total Procedure Duration: 0 hours 18 minutes 9 seconds  Findings:                 The perianal and digital rectal examinations were                            normal.                           An 8 mm polyp was found in the transverse colon.                            The polyp was sessile. The polyp was removed with a                            cold snare. Resection and retrieval were complete.                           A 10 mm polyp was found in the sigmoid colon. The                            polyp was semi-pedunculated. The polyp was removed  with a cold snare. Resection and retrieval were                            complete.                           The exam was otherwise without abnormality on                            direct and retroflexion views. Complications:            No immediate complications. Estimated blood loss:                            None. Estimated Blood Loss:     Estimated blood loss: none. Impression:               - One 8 mm polyp in the transverse colon, removed                            with a cold snare. Resected and retrieved.                           - One 10 mm polyp in the sigmoid colon, removed                            with a cold snare. Resected and retrieved.                           - The examination was otherwise normal on direct                            and retroflexion views. Recommendation:           - Repeat colonoscopy after studies are  complete for                            surveillance based on pathology results.                           - Patient has a contact number available for                            emergencies. The signs and symptoms of potential                            delayed complications were discussed with the                            patient. Return to normal activities tomorrow.                            Written discharge instructions were provided to the                            patient.                           -  Resume previous diet.                           - Continue present medications.                           - Await pathology results. Ladene Artist, MD 12/07/2021 9:40:47 AM This report has been signed electronically.

## 2021-12-07 NOTE — Progress Notes (Signed)
Vital signs checked by:DT  The patient states no changes in medical or surgical history since pre-visit screening on 11/08/21.

## 2021-12-07 NOTE — Progress Notes (Signed)
History & Physical  Primary Care Physician:  Eulas Post, MD Primary Gastroenterologist: Lucio Edward, MD  CHIEF COMPLAINT:  CRC screening  HPI: Chris Perez is a 46 y.o. male average risk CRC screening for colonoscopy.   Past Medical History:  Diagnosis Date   Arthritis    Clotting disorder ([place])    Herpes zoster    T 9 level over abdomen   Von Willebrands disease ([place])    variant; no complications to date    Past Surgical History:  Procedure Laterality Date   APPENDECTOMY     Early  1990's    tonsillar abscess I& D  2003   WISDOM TOOTH EXTRACTION      Prior to Admission medications   Not on File    No current outpatient medications on file.   Current Facility-Administered Medications  Medication Dose Route Frequency Provider Last Rate Last Admin   0.9 %  sodium chloride infusion  500 mL Intravenous Continuous Ladene Artist, MD        Allergies as of 12/07/2021 - Review Complete 12/07/2021  Allergen Reaction Noted   Aspirin  08/27/2010    Family History  Problem Relation Age of Onset   Arthritis Mother    Thyroid disease Mother        on medication    Cancer Mother        thyroid cancer   Thyroid disease Sister        on medication    Cancer Sister        thyroid cancer   Von Willebrand disease Other        mother, niece & ? sister   Colon cancer Neg Hx    Colon polyps Neg Hx    Esophageal cancer Neg Hx    Rectal cancer Neg Hx    Stomach cancer Neg Hx     Social History   Socioeconomic History   Marital status: Married    Spouse name: Not on file   Number of children: Not on file   Years of education: Not on file   Highest education level: Not on file  Occupational History   Not on file  Tobacco Use   Smoking status: Former    Types: Cigarettes    Quit date: 03/17/2008    Years since quitting: 13.7   Smokeless tobacco: Never  Vaping Use   Vaping Use: Former   Devices: Only tried  Substance and Sexual Activity    Alcohol use: Yes    Alcohol/week: 3.0 standard drinks of alcohol    Types: 3 Cans of beer per week   Drug use: No   Sexual activity: Yes    Partners: Female  Other Topics Concern   Not on file  Social History Narrative   Regular exercise: biking 2 x a week   Caffeine Use: seldom         Social Determinants of Radio broadcast assistant Strain: Not on file  Food Insecurity: Not on file  Transportation Needs: Not on file  Physical Activity: Not on file  Stress: Not on file  Social Connections: Not on file  Intimate Partner Violence: Not on file    Review of Systems:  All systems reviewed were negative except where noted in HPI.   Physical Exam: General:  Alert, well-developed, in NAD Head:  Normocephalic and atraumatic. Eyes:  Sclera clear, no icterus.   Conjunctiva pink. Ears:  Normal auditory acuity. Mouth:  No deformity or  lesions.  Neck:  Supple; no masses . Lungs:  Clear throughout to auscultation.   No wheezes, crackles, or rhonchi. No acute distress. Heart:  Regular rate and rhythm; no murmurs. Abdomen:  Soft, nondistended, nontender. No masses, hepatomegaly. No obvious masses.  Normal bowel .    Rectal:  Deferred   Msk:  Symmetrical without gross deformities.. Pulses:  Normal pulses noted. Extremities:  Without edema. Neurologic:  Alert and  oriented x4;  grossly normal neurologically. Skin:  Intact without significant lesions or rashes. Cervical Nodes:  No significant cervical adenopathy. Psych:  Alert and cooperative. Normal mood and affect.   Impression / Plan:   Average risk CRC screening for colonoscopy.  Chris Perez. Fuller Plan  12/07/2021, 9:13 AM See Shea Evans, [facility], to contact our on call provider

## 2021-12-07 NOTE — Progress Notes (Signed)
Report to PACU, RN, vss, BBS= Clear.  

## 2021-12-07 NOTE — Patient Instructions (Signed)
YOU HAD AN ENDOSCOPIC PROCEDURE TODAY AT [facility]:   Refer to the procedure report that was given to you for any specific questions about what was found during the examination.  If the procedure report does not answer your questions, please call your gastroenterologist to clarify.  If you requested that your care partner not be given the details of your procedure findings, then the procedure report has been included in a sealed envelope for you to review at your convenience later.  YOU SHOULD EXPECT: Some feelings of bloating in the abdomen. Passage of more gas than usual.  Walking can help get rid of the air that was put into your GI tract during the procedure and reduce the bloating. If you had a lower endoscopy (such as a colonoscopy or flexible sigmoidoscopy) you may notice spotting of blood in your stool or on the toilet paper. If you underwent a bowel prep for your procedure, you may not have a normal bowel movement for a few days.  Please Note:  You might notice some irritation and congestion in your nose or some drainage.  This is from the oxygen used during your procedure.  There is no need for concern and it should clear up in a day or so.  SYMPTOMS TO REPORT IMMEDIATELY:  Following lower endoscopy (colonoscopy or flexible sigmoidoscopy):  Excessive amounts of blood in the stool  Significant tenderness or worsening of abdominal pains  Swelling of the abdomen that is new, acute  Fever of 100F or higher   For urgent or emergent issues, a gastroenterologist can be reached at any hour by calling [phone]. Do not use MyChart messaging for urgent concerns.    DIET:  We do recommend a small meal at first, but then you may proceed to your regular diet.  Drink plenty of fluids but you should avoid alcoholic beverages for 24 hours.  MEDICATIONS: Continue present medications.  FOLLOW UP: Repeat colonoscopy after studies are complete for surveillance based on pathology  results.  Please see handouts given to you by your recovery nurse: Polyps.  Thank you for allowing [place] to provide for your healthcare needs today.  ACTIVITY:  You should plan to take it easy for the rest of today and you should NOT DRIVE or use heavy machinery until tomorrow (because of the sedation medicines used during the test).    FOLLOW UP: Our staff will call the number listed on your records the next business day following your procedure.  We will call around 7:15- 8:00 am to check on you and address any questions or concerns that you may have regarding the information given to you following your procedure. If we do not reach you, we will leave a message.     If any biopsies were taken you will be contacted by phone or by letter within the next 1-3 weeks.  Please call us at [phone] if you have not heard about the biopsies in 3 weeks.    SIGNATURES/CONFIDENTIALITY: You and/or your care partner have signed paperwork which will be entered into your electronic medical record.  These signatures attest to the fact that that the information above on your After Visit Summary has been reviewed and is understood.  Full responsibility of the confidentiality of this discharge information lies with you and/or your care-partner.

## 2021-12-07 NOTE — Progress Notes (Signed)
Called to room to assist during endoscopic procedure.  Patient ID and intended procedure confirmed with present staff. Received instructions for my participation in the procedure from the performing physician.  

## 2021-12-08 ENCOUNTER — Telehealth: Payer: Self-pay

## 2021-12-08 NOTE — Telephone Encounter (Signed)
Left message on follow up call. 

## 2021-12-21 ENCOUNTER — Encounter: Payer: Self-pay | Admitting: Gastroenterology

## 2022-08-29 ENCOUNTER — Telehealth: Payer: Self-pay | Admitting: Family Medicine

## 2022-08-29 ENCOUNTER — Encounter: Payer: Self-pay | Admitting: Family Medicine

## 2022-08-29 ENCOUNTER — Ambulatory Visit (INDEPENDENT_AMBULATORY_CARE_PROVIDER_SITE_OTHER): Payer: Commercial Managed Care - PPO | Admitting: Family Medicine

## 2022-08-29 VITALS — BP 100/70 | HR 69 | Temp 97.9°F | Ht 75.0 in | Wt 241.1 lb

## 2022-08-29 DIAGNOSIS — E042 Nontoxic multinodular goiter: Secondary | ICD-10-CM | POA: Diagnosis not present

## 2022-08-29 DIAGNOSIS — D126 Benign neoplasm of colon, unspecified: Secondary | ICD-10-CM | POA: Insufficient documentation

## 2022-08-29 DIAGNOSIS — Z0000 Encounter for general adult medical examination without abnormal findings: Secondary | ICD-10-CM

## 2022-08-29 LAB — LIPID PANEL
Cholesterol: 214 mg/dL — ABNORMAL HIGH (ref 0–200)
HDL: 44 mg/dL (ref >39.00)
LDL Cholesterol: 143 mg/dL — ABNORMAL HIGH (ref 0–99)
NonHDL: 169.7
Total CHOL/HDL Ratio: 5
Triglycerides: 134 mg/dL (ref 0.0–149.0)
VLDL: 26.8 mg/dL (ref 0.0–40.0)

## 2022-08-29 LAB — CBC WITH DIFFERENTIAL/PLATELET
Basophils Absolute: 0 10*3/uL (ref 0.0–0.1)
Basophils Relative: 0.8 % (ref 0.0–3.0)
Eosinophils Absolute: 0.1 10*3/uL (ref 0.0–0.7)
Eosinophils Relative: 2.6 % (ref 0.0–5.0)
HCT: 41 % (ref 39.0–52.0)
Hemoglobin: 13.6 g/dL (ref 13.0–17.0)
Lymphocytes Relative: 36.8 % (ref 12.0–46.0)
Lymphs Abs: 1.3 10*3/uL (ref 0.7–4.0)
MCHC: 33.1 g/dL (ref 30.0–36.0)
MCV: 93.2 fl (ref 78.0–100.0)
Monocytes Absolute: 0.4 10*3/uL (ref 0.1–1.0)
Monocytes Relative: 10.6 % (ref 3.0–12.0)
Neutro Abs: 1.7 10*3/uL (ref 1.4–7.7)
Neutrophils Relative %: 49.2 % (ref 43.0–77.0)
Platelets: 216 10*3/uL (ref 150.0–400.0)
RBC: 4.4 Mil/uL (ref 4.22–5.81)
RDW: 13.1 % (ref 11.5–15.5)
WBC: 3.5 10*3/uL — ABNORMAL LOW (ref 4.0–10.5)

## 2022-08-29 LAB — COMPREHENSIVE METABOLIC PANEL
ALT: 27 U/L (ref 0–53)
AST: 26 U/L (ref 0–37)
Albumin: 4.7 g/dL (ref 3.5–5.2)
Alkaline Phosphatase: 43 U/L (ref 39–117)
BUN: 17 mg/dL (ref 6–23)
CO2: 28 mEq/L (ref 19–32)
Calcium: 9.4 mg/dL (ref 8.4–10.5)
Chloride: 100 mEq/L (ref 96–112)
Creatinine, Ser: 0.97 mg/dL (ref 0.40–1.50)
GFR: 93.96 mL/min (ref >60.00)
Glucose, Bld: 83 mg/dL (ref 70–99)
Potassium: 4 mEq/L (ref 3.5–5.1)
Sodium: 135 mEq/L (ref 135–145)
Total Bilirubin: 0.7 mg/dL (ref 0.2–1.2)
Total Protein: 7.1 g/dL (ref 6.0–8.3)

## 2022-08-29 LAB — TSH: TSH: 3.77 u[IU]/mL (ref 0.35–5.50)

## 2022-08-29 LAB — PSA: PSA: 0.36 ng/mL (ref 0.10–4.00)

## 2022-08-29 NOTE — Progress Notes (Signed)
Established Patient Office Visit  Subjective   Patient ID: Chris Perez, male    DOB: 01/17/1976  Age: 47 y.o. MRN: [id number]  Chief Complaint  Patient presents with   Annual Exam    HPI   Chris Perez is here for physical exam.  He has history of migraine headaches, adenoma of the colon, multinodular goiter, lymphocytic thyroiditis, and von Willebrand disease.  Remote history of alcohol abuse.  Abstinent for several years.  Generally doing well.  Takes no regular medications.  Due for repeat colonoscopy 2026.  Over the summer.  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  He also will be taking graduate courses at [employer].  Health maintenance reviewed:  -Prior hepatitis C screen negative -Colonoscopy due 11/26 -Tetanus due 2033  Family history-father has been diagnosed with rheumatoid arthritis.  Sister with thyroid cancer.  Brother without significant health problems.  No known family history of diabetes.  Social history-married with 3 children.  His youngest will start kindergarten this year.  He quit smoking 2010.  No alcohol.  Works as a Runner, broadcasting/film/video.  Will be starting graduate school at [employer] online this fall  Past Medical History:  Diagnosis Date   Arthritis    Clotting disorder ([facility])    Herpes zoster    T 9 level over abdomen   Von Willebrands disease ([facility])    variant; no complications to date   Past Surgical History:  Procedure Laterality Date   APPENDECTOMY     Early  1990's    tonsillar abscess I& D  2003   WISDOM TOOTH EXTRACTION      reports that he quit smoking about 14 years ago. His smoking use included cigarettes. He has never used smokeless tobacco. He reports current alcohol use of about 3.0 standard drinks of alcohol per week. He reports that he does not use drugs. family history includes Arthritis in his mother; Cancer in his mother and sister; Thyroid disease in his mother and sister; Von Willebrand  disease in an other family member. Allergies  Allergen Reactions   Aspirin     Due to medical condition  (von Willebrand's variant)      Review of Systems  Constitutional:  Negative for chills, fever, malaise/fatigue and weight loss.  HENT:  Negative for hearing loss.   Eyes:  Negative for blurred vision and double vision.  Respiratory:  Negative for cough and shortness of breath.   Cardiovascular:  Negative for chest pain, palpitations and leg swelling.  Gastrointestinal:  Negative for abdominal pain, blood in stool, constipation and diarrhea.  Genitourinary:  Negative for dysuria.  Skin:  Negative for rash.  Neurological:  Negative for dizziness, speech change, seizures, loss of consciousness and headaches.  Psychiatric/Behavioral:  Negative for depression.       Objective:     BP 100/70 (BP Location: Left Arm, Patient Position: Sitting, Cuff Size: Large)   Pulse 69   Temp 97.9 F (36.6 C) (Oral)   Ht 6\' 3"  (1.905 m)   Wt 241 lb 1.6 oz (109.4 kg)   SpO2 96%   BMI 30.14 kg/m  BP Readings from Last 3 Encounters:  08/29/22 100/70  12/07/21 120/72  06/29/21 124/70   Wt Readings from Last 3 Encounters:  08/29/22 241 lb 1.6 oz (109.4 kg)  11/08/21 231 lb (104.8 kg)  06/29/21 234 lb 9.6 oz (106.4 kg)      Physical Exam Vitals reviewed.  Constitutional:  General: He is not in acute distress.    Appearance: He is well-developed.  HENT:     Head: Normocephalic and atraumatic.     Right Ear: External ear normal.     Left Ear: External ear normal.  Eyes:     Conjunctiva/sclera: Conjunctivae normal.     Pupils: Pupils are equal, round, and reactive to light.  Neck:     Thyroid: No thyromegaly.  Cardiovascular:     Rate and Rhythm: Normal rate and regular rhythm.     Heart sounds: Normal heart sounds. No murmur heard. Pulmonary:     Effort: No respiratory distress.     Breath sounds: No wheezing or rales.  Abdominal:     General: Bowel sounds are normal.  There is no distension.     Palpations: Abdomen is soft. There is no mass.     Tenderness: There is no abdominal tenderness. There is no guarding or rebound.  Musculoskeletal:     Cervical back: Normal range of motion and neck supple.     Right lower leg: No edema.     Left lower leg: No edema.  Lymphadenopathy:     Cervical: No cervical adenopathy.  Skin:    Findings: No rash.  Neurological:     Mental Status: He is alert and oriented to person, place, and time.     Cranial Nerves: No cranial nerve deficit.      No results found for any visits on 08/29/22.    The 10-year ASCVD risk score (Arnett DK, et al., 2019) is: 0.9%    Assessment & Plan:   Problem List Items Addressed This Visit       Unprioritized   Multinodular goiter (nontoxic) (Chronic)   Relevant Orders   TSH   Adenoma of colon   Other Visit Diagnoses     Physical exam    -  Primary   Relevant Orders   CBC with Differential/Platelet   Comprehensive metabolic panel   Lipid panel   TSH   PSA     Healthy 47 year old male.  History of adenoma of the colon by colonoscopy last year.  We discussed the following health maintenance items  -Consider flu vaccine this fall -Tetanus up-to-date =-Repeat colonoscopy 11/26 -Discussed importance of regular exercise habits.  This will be challenging with his schedule -Obtain screening labs as above.  Patient requesting PSA screening  No follow-ups on file.    Chris Peat, MD

## 2022-08-29 NOTE — Telephone Encounter (Signed)
Patient dropped off document  Physician Health Screening Form , to be filled out by provider. Patient requested to send it back via Fax within 5-days. Document is located in providers tray at front office.Please advise at Mobile [phone] (mobile)

## 2022-08-30 NOTE — Telephone Encounter (Signed)
Pt is returning Chris Perez call concerning blood work results 

## 2022-09-01 NOTE — Telephone Encounter (Signed)
Please see result note 

## 2022-09-09 NOTE — Telephone Encounter (Signed)
Pt called to say insurance company says they still have not received form. Pt is asking for a call back to discuss.

## 2022-09-12 NOTE — Telephone Encounter (Signed)
Forms have been faxed and patient is aware per message below

## 2022-09-12 NOTE — Telephone Encounter (Signed)
Pt call back and was informed that the form was faxed over today.

## 2022-09-27 ENCOUNTER — Telehealth: Payer: Self-pay | Admitting: Family Medicine

## 2022-09-27 NOTE — Telephone Encounter (Signed)
Left detailed message on patient mobile voicemail informing him that forms were faxed on 09/12/22 with [facility] confirmation. Forms have been faxed again as of today and patient was informed of this via Voicemail message.

## 2022-09-27 NOTE — Telephone Encounter (Signed)
Pt is calling back about a form the he drop off that was to be fill out and sent to his Insurance co he stated the Insurance co didn't receive it and he want a call back .

## 2023-02-16 ENCOUNTER — Ambulatory Visit: Payer: Self-pay | Admitting: Family Medicine

## 2023-02-16 ENCOUNTER — Encounter: Payer: Self-pay | Admitting: Family Medicine

## 2023-02-16 ENCOUNTER — Telehealth: Payer: Self-pay | Admitting: *Deleted

## 2023-02-16 ENCOUNTER — Ambulatory Visit: Payer: Commercial Managed Care - PPO | Admitting: Family Medicine

## 2023-02-16 VITALS — BP 88/60 | HR 87 | Temp 98.6°F | Ht 75.0 in | Wt 245.0 lb

## 2023-02-16 DIAGNOSIS — Z20828 Contact with and (suspected) exposure to other viral communicable diseases: Secondary | ICD-10-CM

## 2023-02-16 DIAGNOSIS — R059 Cough, unspecified: Secondary | ICD-10-CM | POA: Diagnosis not present

## 2023-02-16 LAB — POC COVID19 BINAXNOW: SARS Coronavirus 2 Ag: NEGATIVE

## 2023-02-16 LAB — POCT INFLUENZA A/B
Influenza A, POC: NEGATIVE
Influenza B, POC: NEGATIVE

## 2023-02-16 MED ORDER — OSELTAMIVIR PHOSPHATE 75 MG PO CAPS: 75.0000 mg | ORAL_CAPSULE | Freq: Two times a day (BID) | ORAL | 0 refills | Status: DC

## 2023-02-16 NOTE — Telephone Encounter (Signed)
Copied from CRM [id number]. Topic: Clinical - Medication Question >> Feb 16, 2023  2:39 PM Almira Coaster wrote: Reason for CRM: Patient is asking for his oseltamivir (TAMIFLU) 75 MG capsule prescription to be sent to [facility] [address] [place], [place] [address] phone number [phone] as his original [facility] does not have this medication is stock.

## 2023-02-16 NOTE — Progress Notes (Signed)
   Acute Office Visit  Subjective:     Patient ID: Chris Perez, male    DOB: 04/16/1975, 48 y.o.   MRN: [id number]  Chief Complaint  Patient presents with   Cough    Productive with yellow sputum x1 day, tried Tylenol,  Ibuprofen and Mucinex, concerned due to exposure of tlu from his son, also son tested positive for Covid    Cough   Patient is in today for upper respiratory symptoms, states his son tested positive for COVID just yesterday, pt's symptoms started yesterday, coughing with yellow sputum, there is nasal congestion/ pressure, this morning had fever of 101, did take ibuprofen before his visit. There is mild chest pain with coughing, no sore throat.   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   Review of Systems  Respiratory:  Positive for cough.   All other systems reviewed and are negative.       Objective:    BP (!) 88/60   Pulse 87   Temp 98.6 F (37 C) (Oral)   Ht 6\' 3"  (1.905 m)   Wt 245 lb (111.1 kg)   SpO2 98%   BMI 30.62 kg/m    Physical Exam Vitals reviewed.  Constitutional:      Appearance: Normal appearance. He is normal weight.  HENT:     Right Ear: Tympanic membrane normal.     Left Ear: Tympanic membrane normal.  Cardiovascular:     Rate and Rhythm: Normal rate and regular rhythm.     Heart sounds: No murmur heard. Pulmonary:     Effort: Pulmonary effort is normal.     Breath sounds: Normal breath sounds. No wheezing, rhonchi or rales.  Neurological:     Mental Status: He is alert.     Results for orders placed or performed in visit on 02/16/23  POC Influenza A/B  Result Value Ref Range   Influenza A, POC Negative Negative   Influenza B, POC Negative Negative  POC COVID-19  Result Value Ref Range   SARS Coronavirus 2 Ag Negative Negative        Assessment & Plan:   Problem List Items Addressed This Visit   None Visit Diagnoses       Cough, unspecified type     -  Primary   Relevant Orders   POC COVID-19 (Completed)     Exposure to the flu       Relevant Medications   oseltamivir (TAMIFLU) 75 MG capsule   Other Relevant Orders   POC Influenza A/B (Completed)     Pt has both flu and covid exposure. Will go ahead and give him the tamiflu for the exposure to flu,  I advised that he get the home tests and retest himself either tonight or tomorrow morning, and message use the results over my chart. I advised him that sometimes it takes another day for the test to be positive. Will call in paxlovid if his covid test is positive.   Meds ordered this encounter  Medications   oseltamivir (TAMIFLU) 75 MG capsule    Sig: Take 1 capsule (75 mg total) by mouth 2 (two) times daily for 5 days.    Dispense:  10 capsule    Refill:  0    No follow-ups on file.  Karie Georges, MD

## 2023-02-16 NOTE — Telephone Encounter (Signed)
Copied from CRM [id number]. Topic: Clinical - Red Word Triage >> Feb 16, 2023  8:03 AM Chris Perez wrote: [facility] that prompted transfer to Nurse Triage: patient is having fever . Cough congestion aching   Chief Complaint: fever Symptoms: fever, cough, congestion, body aching Disposition: [] ED /[] Urgent Care (no appt availability in office) / [x] Appointment(In office/virtual)/ []  [facility] [facility]/ [] Home Care/ [] Refused Recommended Disposition /[] [facility]/ []  Follow-up with PCP Additional Notes: Patient stated that he is feeling sick since yesterday. He has temp of 101 currently, cough, congestion, body aching. He stated the body aches feels like he got hit by a bus. He tested neg for Covid but stated his son recently tested positive. Patient requesting appointment. Appointment scheduled for today.    Answer Assessment - Initial Assessment Questions 1. ONSET: "When did the cough begin?"      Yesterday  2. SEVERITY: "How bad is the cough today?"      Mild cough   3. SPUTUM: "Describe the color of your sputum" (none, dry cough; clear, white, yellow, green)    N/A  4. DIFFICULTY BREATHING: "Are you having difficulty breathing?" If Yes, ask: "How bad is it?" (e.g., mild, moderate, severe)    - MILD: No SOB at rest, mild SOB with walking, speaks normally in sentences, can lie down, no retractions, pulse < 100.    - MODERATE: SOB at rest, SOB with minimal exertion and prefers to sit, cannot lie down flat, speaks in phrases, mild retractions, audible wheezing, pulse 100-120.    - SEVERE: Very SOB at rest, speaks in single words, struggling to breathe, sitting hunched forward, retractions, pulse > 120      Can't breathe though nose due to congestion  5. FEVER: "Do you have a fever?" If Yes, ask: "What is your temperature, how was it measured, and when did it start?"     101 temp  6. OTHER SYMPTOMS: "Do you have any other symptoms?" (e.g., runny nose, wheezing, chest pain)        Congestion, little bit of chest pain when coughing, body ache  Protocols used: Cough - Acute Non-Productive-A-AH

## 2023-02-17 MED ORDER — OSELTAMIVIR PHOSPHATE 75 MG PO CAPS: 75.0000 mg | ORAL_CAPSULE | Freq: Two times a day (BID) | ORAL | 0 refills | Status: AC

## 2023-02-17 NOTE — Telephone Encounter (Signed)
Spoke with the patient and informed him the Rx was re-sent  as below.  He stated he wanted to let Dr Casimiro Needle know he tested again and was positive for the flu.

## 2023-02-17 NOTE — Telephone Encounter (Signed)
Noted. Ok to close.

## 2023-02-17 NOTE — Telephone Encounter (Signed)
Ok to resend the script

## 2023-03-22 ENCOUNTER — Encounter: Payer: Self-pay | Admitting: Family Medicine

## 2023-03-22 ENCOUNTER — Ambulatory Visit (INDEPENDENT_AMBULATORY_CARE_PROVIDER_SITE_OTHER): Admitting: Family Medicine

## 2023-03-22 VITALS — BP 118/74 | HR 73 | Temp 98.0°F | Wt 246.0 lb

## 2023-03-22 DIAGNOSIS — L309 Dermatitis, unspecified: Secondary | ICD-10-CM | POA: Diagnosis not present

## 2023-03-22 DIAGNOSIS — R233 Spontaneous ecchymoses: Secondary | ICD-10-CM | POA: Diagnosis not present

## 2023-03-22 NOTE — Progress Notes (Unsigned)
 Established Patient Office Visit  Subjective   Patient ID: Chris Perez, male    DOB: 09-Mar-1975  Age: 48 y.o. MRN: [id number]  Chief Complaint  Patient presents with   Rash    Patient complains of rash, x1 month     HPI  {History (Optional):23778} Chris Perez is seen with rash for several weeks now.  He states on the end of January had influenza.  He was fairly sick in the bed for about 5 days but eventually recover.  He has 2 different types of rash.  He has some pruritus and dryness consistent with more eczematous rash right antecubital fossa and neck/upper trunk region.  He has a different rash which is more petechial lower extremities bilaterally with recently some noted waist and lower abdominal region as well.  No known history of vasculitis.  Has had some recent right hip pains but no generalized arthralgias.  No persistent fever.  Denies any abdominal pain, diarrhea, cough, dyspnea.  No lymphadenopathy.  Denies any bleeding from gums, nosebleeds, etc. no aspirin use.  No recent tick bites.  Appetite stable.  Past Medical History:  Diagnosis Date   Arthritis    Clotting disorder (HCC)    Herpes zoster    T 9 level over abdomen   Von Willebrands disease ([facility])    variant; no complications to date   Past Surgical History:  Procedure Laterality Date   APPENDECTOMY     Early  1990's    tonsillar abscess I& D  2003   WISDOM TOOTH EXTRACTION      reports that he quit smoking about 15 years ago. His smoking use included cigarettes. He has never used smokeless tobacco. He reports current alcohol use of about 3.0 standard drinks of alcohol per week. He reports that he does not use drugs. family history includes Arthritis in his mother; Cancer in his mother and sister; Thyroid disease in his mother and sister; Von Willebrand disease in an other family member. Allergies  Allergen Reactions   Aspirin     Due to medical condition  (von Willebrand's variant)    Review of Systems   Constitutional:  Negative for chills and fever.  HENT:  Negative for sinus pain.   Respiratory:  Negative for hemoptysis and shortness of breath.   Cardiovascular:  Negative for chest pain.  Gastrointestinal:  Negative for abdominal pain, blood in stool and diarrhea.  Genitourinary:  Negative for hematuria.  Skin:  Positive for rash.      Objective:     BP 118/74 (BP Location: Left Arm, Patient Position: Sitting, Cuff Size: Normal)   Pulse 73   Temp 98 F (36.7 C) (Oral)   Wt 246 lb (111.6 kg)   SpO2 97%   BMI 30.75 kg/m  BP Readings from Last 3 Encounters:  03/22/23 118/74  02/16/23 (!) 88/60  08/29/22 100/70   Wt Readings from Last 3 Encounters:  03/22/23 246 lb (111.6 kg)  02/16/23 245 lb (111.1 kg)  08/29/22 241 lb 1.6 oz (109.4 kg)      Physical Exam Vitals reviewed.  Constitutional:      General: He is not in acute distress.    Appearance: He is not ill-appearing.  Cardiovascular:     Rate and Rhythm: Normal rate and regular rhythm.  Pulmonary:     Effort: Pulmonary effort is normal.     Breath sounds: Normal breath sounds.  Abdominal:     Palpations: Abdomen is soft.     Tenderness:  There is no abdominal tenderness. There is no guarding or rebound.  Skin:    Findings: Rash present.     Comments: He has somewhat circumferential area of rash right antecubital fossa slightly scaly and more eczematous.  Legs bilaterally reveal nonblanching petechial rash scattered lower extremities.  He also has a couple smaller areas involving waist region and right thigh.  No obvious involvement of trunk otherwise  Neurological:     Mental Status: He is alert.      No results found for any visits on 03/22/23.  {Labs (Optional):23779}  The 10-year ASCVD risk score (Arnett DK, et al., 2019) is: 2.6%    Assessment & Plan:   Problem List Items Addressed This Visit   None Visit Diagnoses       Petechiae    -  Primary   Relevant Orders   CBC with  Differential/Platelet   CMP   Urinalysis with Reflex Microscopic   Sedimentation rate   Protime-INR   APTT     48 year old male with persistent petechial rash to lower extremities and right lower trunk/waist area for the past few weeks.  This followed influenza-like illness into January.  Has not had any persistent fever.  Nontoxic in appearance.  No other bleeding issues.  -Check labs as above -Follow-up immediately for any fever, increased arthralgias, or other new symptoms -Consider skin biopsy of petechial rash if symptoms persist  No follow-ups on file.    [name], MD

## 2023-03-23 LAB — CBC WITH DIFFERENTIAL/PLATELET
Basophils Absolute: 0 10*3/uL (ref 0.0–0.1)
Basophils Relative: 0.9 % (ref 0.0–3.0)
Eosinophils Absolute: 0.1 10*3/uL (ref 0.0–0.7)
Eosinophils Relative: 3.2 % (ref 0.0–5.0)
HCT: 39.7 % (ref 39.0–52.0)
Hemoglobin: 13.5 g/dL (ref 13.0–17.0)
Lymphocytes Relative: 36.4 % (ref 12.0–46.0)
Lymphs Abs: 1.3 10*3/uL (ref 0.7–4.0)
MCHC: 34 g/dL (ref 30.0–36.0)
MCV: 91.4 fl (ref 78.0–100.0)
Monocytes Absolute: 0.4 10*3/uL (ref 0.1–1.0)
Monocytes Relative: 10.9 % (ref 3.0–12.0)
Neutro Abs: 1.8 10*3/uL (ref 1.4–7.7)
Neutrophils Relative %: 48.6 % (ref 43.0–77.0)
Platelets: 269 10*3/uL (ref 150.0–400.0)
RBC: 4.35 Mil/uL (ref 4.22–5.81)
RDW: 13 % (ref 11.5–15.5)
WBC: 3.7 10*3/uL — ABNORMAL LOW (ref 4.0–10.5)

## 2023-03-23 LAB — COMPREHENSIVE METABOLIC PANEL
ALT: 17 U/L (ref 0–53)
AST: 20 U/L (ref 0–37)
Albumin: 4.6 g/dL (ref 3.5–5.2)
Alkaline Phosphatase: 52 U/L (ref 39–117)
BUN: 18 mg/dL (ref 6–23)
CO2: 28 meq/L (ref 19–32)
Calcium: 9.4 mg/dL (ref 8.4–10.5)
Chloride: 101 meq/L (ref 96–112)
Creatinine, Ser: 1 mg/dL (ref 0.40–1.50)
GFR: 90.23 mL/min (ref >60.00)
Glucose, Bld: 91 mg/dL (ref 70–99)
Potassium: 4.2 meq/L (ref 3.5–5.1)
Sodium: 138 meq/L (ref 135–145)
Total Bilirubin: 0.4 mg/dL (ref 0.2–1.2)
Total Protein: 7.4 g/dL (ref 6.0–8.3)

## 2023-03-23 LAB — SEDIMENTATION RATE: Sed Rate: 18 mm/h — ABNORMAL HIGH (ref 0–15)

## 2023-03-23 LAB — URINALYSIS, ROUTINE W REFLEX MICROSCOPIC
Bilirubin Urine: NEGATIVE
Hgb urine dipstick: NEGATIVE
Ketones, ur: NEGATIVE
Leukocytes,Ua: NEGATIVE
Nitrite: NEGATIVE
RBC / HPF: NONE SEEN (ref 0–?)
Specific Gravity, Urine: 1.01 (ref 1.000–1.030)
Total Protein, Urine: NEGATIVE
Urine Glucose: NEGATIVE
Urobilinogen, UA: 0.2 (ref 0.0–1.0)
WBC, UA: NONE SEEN (ref 0–?)
pH: 7 (ref 5.0–8.0)

## 2023-03-23 LAB — PROTIME-INR
INR: 1.2 ratio — ABNORMAL HIGH (ref 0.8–1.0)
Prothrombin Time: 13.1 s (ref 9.6–13.1)

## 2023-03-23 LAB — APTT: aPTT: 31.7 s (ref 25.4–36.8)

## 2023-03-24 ENCOUNTER — Telehealth: Payer: Self-pay

## 2023-03-24 NOTE — Telephone Encounter (Signed)
 Copied from CRM [id number]. Topic: Clinical - Lab/Test Results >> Mar 24, 2023  2:36 PM Isabell A wrote: Reason for CRM: Patient is calling to speak with someone in the office in regard to his abnormal results - has additional questions.

## 2023-03-27 ENCOUNTER — Telehealth: Payer: Self-pay

## 2023-03-27 ENCOUNTER — Encounter: Payer: Self-pay | Admitting: Family Medicine

## 2023-03-27 NOTE — Telephone Encounter (Signed)
 Copied from CRM [id number]. Topic: Clinical - Lab/Test Results >> Mar 27, 2023 11:01 AM Denese Killings wrote: Reason for CRM: Patient is returning a call from nurse regarding lab. Lunch at 12:15 or call back at 04:30

## 2023-03-27 NOTE — Telephone Encounter (Signed)
Please see encounter from today

## 2023-03-28 ENCOUNTER — Telehealth: Payer: Self-pay

## 2023-03-28 NOTE — Telephone Encounter (Signed)
 Copied from CRM [id number]. Topic: Clinical - Lab/Test Results >> Mar 27, 2023  4:53 PM Sonny Dandy B wrote: Reason for CRM Pt called to request lab results. Please call pt back at [phone]

## 2023-03-29 NOTE — Telephone Encounter (Signed)
 Noted.

## 2023-03-31 ENCOUNTER — Encounter: Payer: Self-pay | Admitting: Family Medicine

## 2023-03-31 NOTE — Telephone Encounter (Signed)
 Please see result note

## 2023-04-10 ENCOUNTER — Ambulatory Visit (INDEPENDENT_AMBULATORY_CARE_PROVIDER_SITE_OTHER): Admitting: Family Medicine

## 2023-04-10 ENCOUNTER — Encounter: Payer: Self-pay | Admitting: Family Medicine

## 2023-04-10 VITALS — BP 120/80 | HR 68 | Temp 97.8°F | Wt 238.5 lb

## 2023-04-10 DIAGNOSIS — D6800 Von Willebrand disease, unspecified: Secondary | ICD-10-CM | POA: Diagnosis not present

## 2023-04-10 DIAGNOSIS — R233 Spontaneous ecchymoses: Secondary | ICD-10-CM | POA: Diagnosis not present

## 2023-04-10 MED ORDER — PREDNISONE 10 MG PO TABS: ORAL_TABLET | ORAL | 0 refills | Status: DC

## 2023-04-10 NOTE — Progress Notes (Signed)
 Established Patient Office Visit  Subjective   Patient ID: Chris Perez, male    DOB: 20-Apr-1975  Age: 48 y.o. MRN: [id number]  Chief Complaint  Patient presents with   Rash    HPI   Ignatz is seen with some persistent petechiae on his lower extremities and has had some spread into the lower abdominal region and even into inner arms bilaterally over the past several days.  Overall feels well.  He did have fairly severe case of flu back several weeks prior to the rash.  He denies any other bleeding issues.  No nosebleeds, gum bleeds, rectal bleeding, etc.  No fever.  No polyarthralgias.  He had recent lab work which was all basically reassuring.  Platelets were normal.  Does have history of von Willebrand disease but no recent mucosal bleeding.  No abdominal pain.  No tick bites.  Recent renal function stable.  Recent sed rate 18.  Past Medical History:  Diagnosis Date   Arthritis    Clotting disorder (HCC)    Herpes zoster    T 9 level over abdomen   Von Willebrands disease (HCC)    variant; no complications to date   Past Surgical History:  Procedure Laterality Date   APPENDECTOMY     Early  1990's    tonsillar abscess I& D  2003   WISDOM TOOTH EXTRACTION      reports that he quit smoking about 15 years ago. His smoking use included cigarettes. He has never used smokeless tobacco. He reports current alcohol use of about 3.0 standard drinks of alcohol per week. He reports that he does not use drugs. family history includes Arthritis in his mother; Cancer in his mother and sister; Thyroid disease in his mother and sister; Von Willebrand disease in an other family member. Allergies  Allergen Reactions   Aspirin     Due to medical condition  (von Willebrand's variant)    Review of Systems  Constitutional:  Negative for chills, fever, malaise/fatigue and weight loss.  Respiratory:  Negative for cough and shortness of breath.   Cardiovascular:  Negative for chest pain.   Gastrointestinal:  Negative for abdominal pain and blood in stool.  Genitourinary:  Negative for hematuria.  Musculoskeletal:  Negative for joint pain.  Neurological:  Negative for dizziness and headaches.      Objective:     BP 120/80 (BP Location: Left Arm, Patient Position: Sitting, Cuff Size: Normal)   Pulse 68   Temp 97.8 F (36.6 C) (Oral)   Wt 238 lb 8 oz (108.2 kg)   SpO2 97%   BMI 29.81 kg/m  BP Readings from Last 3 Encounters:  04/10/23 120/80  03/22/23 118/74  02/16/23 (!) 88/60   Wt Readings from Last 3 Encounters:  04/10/23 238 lb 8 oz (108.2 kg)  03/22/23 246 lb (111.6 kg)  02/16/23 245 lb (111.1 kg)      Physical Exam Vitals reviewed.  Constitutional:      General: He is not in acute distress.    Appearance: He is not ill-appearing.  Cardiovascular:     Rate and Rhythm: Normal rate and regular rhythm.  Skin:    Findings: Rash present.     Comments: He has scattered petechiae lower extremities with extension up to the waist area.  He has a few scattered petechiae in her arms bilaterally.  Sparing of trunk otherwise.  No head or neck involvement.  These are nonpalpable.  Neurological:     Mental  Status: He is alert.      No results found for any visits on 04/10/23.  Last CBC Lab Results  Component Value Date   WBC 3.7 (L) 03/22/2023   HGB 13.5 03/22/2023   HCT 39.7 03/22/2023   MCV 91.4 03/22/2023   MCH 31.3 11/13/2019   RDW 13.0 03/22/2023   PLT 269.0 03/22/2023   Last metabolic panel Lab Results  Component Value Date   GLUCOSE 91 03/22/2023   NA 138 03/22/2023   K 4.2 03/22/2023   CL 101 03/22/2023   CO2 28 03/22/2023   BUN 18 03/22/2023   CREATININE 1.00 03/22/2023   GFR 90.23 03/22/2023   CALCIUM 9.4 03/22/2023   PROT 7.4 03/22/2023   ALBUMIN 4.6 03/22/2023   BILITOT 0.4 03/22/2023   ALKPHOS 52 03/22/2023   AST 20 03/22/2023   ALT 17 03/22/2023   Last thyroid functions Lab Results  Component Value Date   TSH 3.77  08/29/2022   T3TOTAL 99.3 05/08/2012   T4TOTAL 4.9 (L) 05/08/2012      The 10-year ASCVD risk score (Arnett DK, et al., 2019) is: 2.6%    Assessment & Plan:   Problem List Items Addressed This Visit   None Visit Diagnoses       Petechiae    -  Primary   Relevant Orders   CBC with Differential/Platelet   CMP   Sedimentation rate     Persistent petechial lower extremities with some extension to the trunk involving lower waist region.  No other bleeding issues in terms of gums, nasal, rectal, etc.  He denies any recent fever.  No polyarthralgias.  No abdominal pain.  Suspect this is autoimmune related type rash possibly from recent virus.  Otherwise feels well.  -Recheck sed rate, CMP, CBC -Recommend start prednisone 40 mg daily and taper over 10 days -Consider hematology consult if petechiae persist especially in view of his von Willebrand's disease  No follow-ups on file.    [name], MD

## 2023-04-10 NOTE — Patient Instructions (Addendum)
 Petechiae- are very non-specific and can be seen with many diseases  Suspect this is any auto-immune response- possibly to recent infection.  Watch for any fever, joint pains, bleeding (rectal, nose, gum)

## 2023-04-11 LAB — CBC WITH DIFFERENTIAL/PLATELET
Basophils Absolute: 0 10*3/uL (ref 0.0–0.1)
Basophils Relative: 0.9 % (ref 0.0–3.0)
Eosinophils Absolute: 0.1 10*3/uL (ref 0.0–0.7)
Eosinophils Relative: 2.4 % (ref 0.0–5.0)
HCT: 40.7 % (ref 39.0–52.0)
Hemoglobin: 13.8 g/dL (ref 13.0–17.0)
Lymphocytes Relative: 36.9 % (ref 12.0–46.0)
Lymphs Abs: 1.5 10*3/uL (ref 0.7–4.0)
MCHC: 34 g/dL (ref 30.0–36.0)
MCV: 91.6 fl (ref 78.0–100.0)
Monocytes Absolute: 0.4 10*3/uL (ref 0.1–1.0)
Monocytes Relative: 9.1 % (ref 3.0–12.0)
Neutro Abs: 2.1 10*3/uL (ref 1.4–7.7)
Neutrophils Relative %: 50.7 % (ref 43.0–77.0)
Platelets: 237 10*3/uL (ref 150.0–400.0)
RBC: 4.44 Mil/uL (ref 4.22–5.81)
RDW: 12.9 % (ref 11.5–15.5)
WBC: 4.1 10*3/uL (ref 4.0–10.5)

## 2023-04-11 LAB — COMPREHENSIVE METABOLIC PANEL
ALT: 16 U/L (ref 0–53)
AST: 20 U/L (ref 0–37)
Albumin: 4.7 g/dL (ref 3.5–5.2)
Alkaline Phosphatase: 52 U/L (ref 39–117)
BUN: 20 mg/dL (ref 6–23)
CO2: 29 meq/L (ref 19–32)
Calcium: 9.6 mg/dL (ref 8.4–10.5)
Chloride: 101 meq/L (ref 96–112)
Creatinine, Ser: 1.06 mg/dL (ref 0.40–1.50)
GFR: 84.11 mL/min (ref >60.00)
Glucose, Bld: 93 mg/dL (ref 70–99)
Potassium: 4.3 meq/L (ref 3.5–5.1)
Sodium: 138 meq/L (ref 135–145)
Total Bilirubin: 0.4 mg/dL (ref 0.2–1.2)
Total Protein: 7.4 g/dL (ref 6.0–8.3)

## 2023-04-11 LAB — SEDIMENTATION RATE: Sed Rate: 2 mm/h (ref 0–15)

## 2023-07-12 ENCOUNTER — Ambulatory Visit (INDEPENDENT_AMBULATORY_CARE_PROVIDER_SITE_OTHER): Admitting: Family Medicine

## 2023-07-12 VITALS — BP 124/74 | HR 82 | Temp 98.5°F | Wt 225.8 lb

## 2023-07-12 DIAGNOSIS — N50812 Left testicular pain: Secondary | ICD-10-CM | POA: Diagnosis not present

## 2023-07-12 NOTE — Progress Notes (Signed)
 Established Patient Office Visit  Subjective   Patient ID: Chris Perez, male    DOB: Jun 06, 1975  Age: 48 y.o. MRN: [id number]  Chief Complaint  Patient presents with   Testicle Pain    HPI   Chris Perez is seen today with approximately 2-week history of left scrotal pain.  He describes somewhat of a sharp shooting pain at times and more of a dull ache in between.  Back in March he noticed his left testicle seems slightly swollen.  Denies any injury.  Has not noted any urinary symptoms.  No distinct masses.  He did have vasectomy around 2020 and has had some scarring/possible granuloma type tissue related to that.  No recent inguinal adenopathy.  Past Medical History:  Diagnosis Date   Arthritis    Clotting disorder (HCC)    Herpes zoster    T 9 level over abdomen   Von Willebrands disease (HCC)    variant; no complications to date   Past Surgical History:  Procedure Laterality Date   APPENDECTOMY     Early  1990's    tonsillar abscess I& D  2003   WISDOM TOOTH EXTRACTION      reports that he quit smoking about 15 years ago. His smoking use included cigarettes. He has never used smokeless tobacco. He reports current alcohol use of about 3.0 standard drinks of alcohol per week. He reports that he does not use drugs. family history includes Arthritis in his mother; Cancer in his mother and sister; Thyroid  disease in his mother and sister; Von Willebrand disease in an other family member. Allergies  Allergen Reactions   Aspirin     Due to medical condition  (von Willebrand's variant)    Review of Systems  Constitutional:  Negative for chills and fever.  Genitourinary:  Negative for dysuria.      Objective:     BP 124/74 (BP Location: Left Arm, Patient Position: Sitting, Cuff Size: Normal)   Pulse 82   Temp 98.5 F (36.9 C) (Oral)   Wt 225 lb 12.8 oz (102.4 kg)   SpO2 97%   BMI 28.22 kg/m  BP Readings from Last 3 Encounters:  07/12/23 124/74  04/10/23 120/80   03/22/23 118/74   Wt Readings from Last 3 Encounters:  07/12/23 225 lb 12.8 oz (102.4 kg)  04/10/23 238 lb 8 oz (108.2 kg)  03/22/23 246 lb (111.6 kg)      Physical Exam Vitals reviewed.  Constitutional:      General: He is not in acute distress.    Appearance: He is not ill-appearing.   Cardiovascular:     Rate and Rhythm: Normal rate and regular rhythm.  Genitourinary:    Comments: Scrotal exam reveals both testes descended.  He does have some mild tenderness superior pole of the testicle region in the region of the epididymis.  Rounded density in this region which is firm to palpation and may represent either granuloma versus epididymis cyst.  No hernia palpated.  Neurological:     Mental Status: He is alert.      No results found for any visits on 07/12/23.  Last CBC Lab Results  Component Value Date   WBC 4.1 04/10/2023   HGB 13.8 04/10/2023   HCT 40.7 04/10/2023   MCV 91.6 04/10/2023   MCH 31.3 11/13/2019   RDW 12.9 04/10/2023   PLT 237.0 04/10/2023   Last metabolic panel Lab Results  Component Value Date   GLUCOSE 93 04/10/2023   NA  138 04/10/2023   K 4.3 04/10/2023   CL 101 04/10/2023   CO2 29 04/10/2023   BUN 20 04/10/2023   CREATININE 1.06 04/10/2023   GFR 84.11 04/10/2023   CALCIUM 9.6 04/10/2023   PROT 7.4 04/10/2023   ALBUMIN 4.7 04/10/2023   BILITOT 0.4 04/10/2023   ALKPHOS 52 04/10/2023   AST 20 04/10/2023   ALT 16 04/10/2023      The 10-year ASCVD risk score (Arnett DK, et al., 2019) is: 2.8%    Assessment & Plan:   Problem List Items Addressed This Visit   None Visit Diagnoses       Pain in left testicle    -  Primary   Relevant Orders   US  SCROTUM W/DOPPLER     Left scrotal pain for least couple weeks.  Etiology unclear.  No hernia.  No distinct testicular mass but does have rounded density epididymis region which may represent epididymis cyst--- check scrotal ultrasound to further assess  No follow-ups on file.     Wolm Scarlet, MD

## 2023-07-13 ENCOUNTER — Ambulatory Visit: Payer: Self-pay

## 2023-07-13 NOTE — Telephone Encounter (Signed)
 FYI Only or Action Required?: Action required by provider: When will Scrotal Ultrasound be Scheduled.  Patient was last seen in primary care on 07/12/2023 by Micheal Wolm ORN, MD. Called Nurse Triage reporting Groin Pain. Symptoms began several days ago. Interventions attempted: Nothing. Symptoms are: unchanged.  Triage Disposition: Call PCP When Office is Open  Patient/caregiver understands and will follow disposition?: No, wishes to speak with PCP  **Please see note below**                    Copied from CRM #[id number]. Topic: Clinical - Red Word Triage >> Jul 13, 2023  4:29 PM Rosina BIRCH wrote: Reason for CRM: patient called stating the imaging place has not contacted him in regards to an ultrasound sound appointment. The patient stated the pain has gotten worse in his testicle/groin area. It has been going on for a couple months now Reason for Disposition  [1] Caller requesting NON-URGENT health information AND [2] PCP's office is the best resource  Answer Assessment - Initial Assessment Questions 1. REASON FOR CALL or QUESTION: What is your reason for calling today? or How can I best help you? or What question do you have that I can help answer?      Patient called in stating that he was supposed to get a call to schedule his scrotal ultrasound and has not heard back. He states the pain persists and will take Tylenol /Ibuprofen to alleviate some pain. He would like to know when he should expect a call as he was told today he would.  Protocols used: Information Only Call - No Triage-A-AH

## 2023-07-17 ENCOUNTER — Ambulatory Visit (HOSPITAL_BASED_OUTPATIENT_CLINIC_OR_DEPARTMENT_OTHER)
Admission: RE | Admit: 2023-07-17 | Discharge: 2023-07-17 | Disposition: A | Discharge status: Home / Self Care | Source: Ambulatory Visit | Attending: Family Medicine | Admitting: Family Medicine

## 2023-07-17 DIAGNOSIS — N50812 Left testicular pain: Secondary | ICD-10-CM | POA: Diagnosis present

## 2023-07-23 ENCOUNTER — Ambulatory Visit: Payer: Self-pay | Admitting: Family Medicine

## 2023-07-23 DIAGNOSIS — N50812 Left testicular pain: Secondary | ICD-10-CM

## 2023-07-24 NOTE — Addendum Note (Signed)
 Addended by: METTA KRISTEN CROME on: 07/24/2023 09:53 AM   Modules accepted: Orders

## 2024-02-08 ENCOUNTER — Ambulatory Visit (INDEPENDENT_AMBULATORY_CARE_PROVIDER_SITE_OTHER): Admitting: Adult Health

## 2024-02-08 ENCOUNTER — Ambulatory Visit: Payer: Self-pay

## 2024-02-08 VITALS — BP 120/82 | HR 70 | Temp 98.2°F | Wt 251.0 lb

## 2024-02-08 DIAGNOSIS — J020 Streptococcal pharyngitis: Secondary | ICD-10-CM | POA: Diagnosis not present

## 2024-02-08 LAB — POCT RAPID STREP A (OFFICE): Rapid Strep A Screen: POSITIVE — AB

## 2024-02-08 MED ORDER — PREDNISONE 10 MG PO TABS: 10.0000 mg | ORAL_TABLET | Freq: Every day | ORAL | 0 refills | Status: AC

## 2024-02-08 MED ORDER — AMOXICILLIN 875 MG PO TABS: 875.0000 mg | ORAL_TABLET | Freq: Two times a day (BID) | ORAL | 0 refills | Status: AC

## 2024-02-08 NOTE — Progress Notes (Signed)
 "  Subjective:    Patient ID: Chris Perez, male    DOB: 1975-12-14, 49 y.o.   MRN: [id number]  HPI 49 year old male who  has a past medical history of Arthritis, Clotting disorder, Herpes zoster, and Von Willebrands disease (HCC).  He is a patient of Dr.Burchette who I am seeing today for an acute issue. He reports three days of sore throat, especially when swallowing food or drink,  body aches, and generalized malaise.  He denies fevers, cough, or shortness of breath.   His young child was recently diagnosed with strep.   At home he took covid and flu test this morning and it was negative.   He has been using Advil with some relief.    Review of Systems See HPI   Past Medical History:  Diagnosis Date   Arthritis    Clotting disorder    Herpes zoster    T 9 level over abdomen   Von Willebrands disease (HCC)    variant; no complications to date    Social History   Socioeconomic History   Marital status: Married    Spouse name: Not on file   Number of children: Not on file   Years of education: Not on file   Highest education level: Bachelor's degree (e.g., BA, AB, BS)  Occupational History   Not on file  Tobacco Use   Smoking status: Former    Current packs/day: 0.00    Types: Cigarettes    Quit date: 03/17/2008    Years since quitting: 15.9   Smokeless tobacco: Never  Vaping Use   Vaping status: Former   Devices: Only tried  Substance and Sexual Activity   Alcohol use: Yes    Alcohol/week: 3.0 standard drinks of alcohol    Types: 3 Cans of beer per week   Drug use: No   Sexual activity: Yes    Partners: Female  Other Topics Concern   Not on file  Social History Narrative   Regular exercise: biking 2 x a week   Caffeine Use: seldom         Social Drivers of Health   Tobacco Use: Medium Risk (02/08/2024)   Patient History    Smoking Tobacco Use: Former    Smokeless Tobacco Use: Never    Passive Exposure: Not on file  Financial Resource Strain:  Medium Risk (02/08/2024)   Overall Financial Resource Strain (CARDIA)    Difficulty of Paying Living Expenses: Somewhat hard  Food Insecurity: No Food Insecurity (02/08/2024)   Epic    Worried About Radiation Protection Practitioner of Food in the Last Year: Never true    Ran Out of Food in the Last Year: Never true  Transportation Needs: No Transportation Needs (02/08/2024)   Epic    Lack of Transportation (Medical): No    Lack of Transportation (Non-Medical): No  Physical Activity: Insufficiently Active (02/08/2024)   Exercise Vital Sign    Days of Exercise per Week: 2 days    Minutes of Exercise per Session: 30 min  Stress: No Stress Concern Present (02/08/2024)   [employer] of Occupational Health - Occupational Stress Questionnaire    Feeling of Stress: Only a little  Social Connections: Socially Isolated (02/08/2024)   Social Connection and Isolation Panel    Frequency of Communication with Friends and Family: Once a week    Frequency of Social Gatherings with Friends and Family: Once a week    Attends Religious Services: Never    Active Member  of Clubs or Organizations: No    Attends Banker Meetings: Not on file    Marital Status: Married  Intimate Partner Violence: Not on file  Depression (PHQ2-9): Low Risk (08/29/2022)   Depression (PHQ2-9)    PHQ-2 Score: 0  Alcohol Screen: Low Risk (07/12/2023)   Alcohol Screen    Last Alcohol Screening Score (AUDIT): 3  Housing: Unknown (02/08/2024)   Epic    Unable to Pay for Housing in the Last Year: No    Number of Times Moved in the Last Year: Not on file    Homeless in the Last Year: No  Utilities: Not on file  Health Literacy: Not on file    Past Surgical History:  Procedure Laterality Date   APPENDECTOMY     Early  1990's    tonsillar abscess I& D  2003   WISDOM TOOTH EXTRACTION      Family History  Problem Relation Age of Onset   Arthritis Mother    Thyroid  disease Mother        on medication    Cancer Mother         thyroid  cancer   Thyroid  disease Sister        on medication    Cancer Sister        thyroid  cancer   Von Willebrand disease Other        mother, niece & ? sister   Colon cancer Neg Hx    Colon polyps Neg Hx    Esophageal cancer Neg Hx    Rectal cancer Neg Hx    Stomach cancer Neg Hx     Allergies[1]  Medications Ordered Prior to Encounter[2]  BP 120/82   Pulse 70   Temp 98.2 F (36.8 C) (Oral)   Wt 251 lb (113.9 kg)   SpO2 96%   BMI 31.37 kg/m       Objective:   Physical Exam Constitutional:      Appearance: He is well-developed.  HENT:     Mouth/Throat:     Pharynx: Posterior oropharyngeal erythema present. No pharyngeal swelling or oropharyngeal exudate.     Tonsils: No tonsillar exudate or tonsillar abscesses. 2+ on the right. 2+ on the left.  Cardiovascular:     Rate and Rhythm: Normal rate and regular rhythm.     Pulses: Normal pulses.     Heart sounds: Normal heart sounds.  Pulmonary:     Effort: Pulmonary effort is normal.     Breath sounds: Normal breath sounds.  Musculoskeletal:        General: Normal range of motion.  Lymphadenopathy:     Head:     Right side of head: Tonsillar adenopathy present.     Left side of head: Tonsillar adenopathy present.  Skin:    General: Skin is warm and dry.  Neurological:     General: No focal deficit present.     Mental Status: He is alert and oriented to person, place, and time.  Psychiatric:        Mood and Affect: Mood normal.        Behavior: Behavior normal.        Thought Content: Thought content normal.        Judgment: Judgment normal.        Assessment & Plan:  1. Strep throat (Primary)  - POC Rapid Strep A- Positive  - Will treat for strep throat with Augmentin  and steroids - Can continue to use advil. Also  advised warm drinks  - amoxicillin  (AMOXIL ) 875 MG tablet; Take 1 tablet (875 mg total) by mouth 2 (two) times daily for 10 days.  Dispense: 20 tablet; Refill: 0 - predniSONE  (DELTASONE )  10 MG tablet; Take 1 tablet (10 mg total) by mouth daily with breakfast.  Dispense: 7 tablet; Refill: 0 - Follow up as needed or if symptoms do not improve in the next 3-4 days   Darleene Shape, NP       [1]  Allergies Allergen Reactions   Aspirin     Due to medical condition  (von Willebrand's variant)  [2]  No current outpatient medications on file prior to visit.   No current facility-administered medications on file prior to visit.   "

## 2024-02-08 NOTE — Telephone Encounter (Signed)
 Called [facility] and advised Madeline of patient's symptoms and request.  She advised an office visit would be required at this time. This RN called patient back and advised him.  He understands and will be here at his appointment time unless something changes.

## 2024-02-08 NOTE — Telephone Encounter (Signed)
 FYI Only or Action Required?: Action required by provider: clinical question for provider and update on patient condition.  Patient was last seen in primary care on 07/12/2023 by Micheal Wolm ORN, MD.  Called Nurse Triage reporting Sore Throat.  Symptoms began about two days ago.  Interventions attempted: OTC medications: ibuprofen, Rest, hydration, or home remedies, and Other: soups.  Symptoms are: body aches are better but the throat feels worse today.  Triage Disposition: Call PCP Within 24 Hours  Patient/caregiver understands and will follow disposition?: Yes             Copied from CRM #[id number]. Topic: Clinical - Medical Advice >> Feb 08, 2024 10:31 AM Delon T wrote: Reason for CRM:  Patient scheduled appt but took a home test for flu and covid and they were both negative, he thinks he caught strep throat from son- no fever- asking if he needs to come in or if an antibiotic can be called in- [phone] Reason for Disposition  [1] Strep throat EXPOSURE within past 10 days AND [2] sore throat  Answer Assessment - Initial Assessment Questions Patient called and states his throat starting hurting about two days ago with some generalized overall achiness He took a home COVID/FLU test at home--they were negative Today he states his body aches feel better--  Patient's son had strep throat over the week--about 3 days ago he was diagnosed with strep and an ear infection  Patient states that the only symptom he is having is a sore throat at this time He is still able to eat and drink. Pain is about a 7 out of 10.  He denies a fever, chest pain, difficulty breathing, chest pain, vomiting. He has been using ibuprofen the past few days  Patient made an appointment for today but wanted to see if it would be possible for an antibiotic to be called in and he not have to physically come in for the office visit---he states his reason is because of his insurance  Patient is  advised to call us  back if anything changes or with any further questions/concerns. Patient is advised that if anything worsens to go to the Emergency Room. Patient verbalized understanding.  Protocols used: Strep Throat Exposure-A-AH
